# Patient Record
Sex: Female | Born: 1935 | Race: Black or African American | Hispanic: No | Smoking: Never smoker
Health system: Southern US, Community
[De-identification: ages and names within clinical notes are randomized; demographics above are authoritative.]

## PROBLEM LIST (undated history)

## (undated) DIAGNOSIS — C541 Malignant neoplasm of endometrium: Secondary | ICD-10-CM

## (undated) DIAGNOSIS — I10 Essential (primary) hypertension: Secondary | ICD-10-CM

## (undated) DIAGNOSIS — J302 Other seasonal allergic rhinitis: Secondary | ICD-10-CM

## (undated) HISTORY — PX: TONSILLECTOMY AND ADENOIDECTOMY: SUR1326

---

## 2011-10-09 HISTORY — PX: ABDOMINAL HYSTERECTOMY: SHX81

## 2014-11-11 ENCOUNTER — Inpatient Hospital Stay (HOSPITAL_COMMUNITY)
Admission: EM | Admit: 2014-11-11 | Discharge: 2014-11-16 | DRG: 065 | Disposition: A | Discharge status: Rehabilitation Facility | Payer: Medicare Other | Attending: Neurology | Admitting: Neurology

## 2014-11-11 ENCOUNTER — Emergency Department (HOSPITAL_COMMUNITY): Payer: Medicare Other

## 2014-11-11 ENCOUNTER — Encounter (HOSPITAL_COMMUNITY): Payer: Self-pay | Admitting: Emergency Medicine

## 2014-11-11 DIAGNOSIS — I10 Essential (primary) hypertension: Secondary | ICD-10-CM | POA: Diagnosis present

## 2014-11-11 DIAGNOSIS — D649 Anemia, unspecified: Secondary | ICD-10-CM | POA: Diagnosis present

## 2014-11-11 DIAGNOSIS — Z9119 Patient's noncompliance with other medical treatment and regimen: Secondary | ICD-10-CM | POA: Diagnosis present

## 2014-11-11 DIAGNOSIS — I253 Aneurysm of heart: Secondary | ICD-10-CM | POA: Diagnosis present

## 2014-11-11 DIAGNOSIS — E785 Hyperlipidemia, unspecified: Secondary | ICD-10-CM | POA: Diagnosis present

## 2014-11-11 DIAGNOSIS — G8191 Hemiplegia, unspecified affecting right dominant side: Secondary | ICD-10-CM | POA: Diagnosis present

## 2014-11-11 DIAGNOSIS — I610 Nontraumatic intracerebral hemorrhage in hemisphere, subcortical: Principal | ICD-10-CM | POA: Diagnosis present

## 2014-11-11 DIAGNOSIS — Z79899 Other long term (current) drug therapy: Secondary | ICD-10-CM | POA: Diagnosis not present

## 2014-11-11 DIAGNOSIS — Z90710 Acquired absence of both cervix and uterus: Secondary | ICD-10-CM | POA: Diagnosis not present

## 2014-11-11 DIAGNOSIS — I619 Nontraumatic intracerebral hemorrhage, unspecified: Secondary | ICD-10-CM | POA: Diagnosis present

## 2014-11-11 DIAGNOSIS — E876 Hypokalemia: Secondary | ICD-10-CM | POA: Diagnosis present

## 2014-11-11 DIAGNOSIS — I615 Nontraumatic intracerebral hemorrhage, intraventricular: Secondary | ICD-10-CM | POA: Diagnosis not present

## 2014-11-11 DIAGNOSIS — R4701 Aphasia: Secondary | ICD-10-CM | POA: Diagnosis present

## 2014-11-11 DIAGNOSIS — I69151 Hemiplegia and hemiparesis following nontraumatic intracerebral hemorrhage affecting right dominant side: Secondary | ICD-10-CM | POA: Diagnosis not present

## 2014-11-11 DIAGNOSIS — I618 Other nontraumatic intracerebral hemorrhage: Secondary | ICD-10-CM | POA: Insufficient documentation

## 2014-11-11 DIAGNOSIS — R2990 Unspecified symptoms and signs involving the nervous system: Secondary | ICD-10-CM

## 2014-11-11 DIAGNOSIS — I161 Hypertensive emergency: Secondary | ICD-10-CM

## 2014-11-11 HISTORY — DX: Other seasonal allergic rhinitis: J30.2

## 2014-11-11 HISTORY — DX: Malignant neoplasm of endometrium: C54.1

## 2014-11-11 HISTORY — DX: Essential (primary) hypertension: I10

## 2014-11-11 LAB — I-STAT TROPONIN, ED: TROPONIN I, POC: 0.01 ng/mL (ref 0.00–0.08)

## 2014-11-11 LAB — DIFFERENTIAL
Basophils Absolute: 0 10*3/uL (ref 0.0–0.1)
Basophils Relative: 1 % (ref 0–1)
EOS ABS: 0.1 10*3/uL (ref 0.0–0.7)
Eosinophils Relative: 1 % (ref 0–5)
LYMPHS ABS: 1.8 10*3/uL (ref 0.7–4.0)
LYMPHS PCT: 26 % (ref 12–46)
MONOS PCT: 7 % (ref 3–12)
Monocytes Absolute: 0.5 10*3/uL (ref 0.1–1.0)
NEUTROS PCT: 65 % (ref 43–77)
Neutro Abs: 4.6 10*3/uL (ref 1.7–7.7)

## 2014-11-11 LAB — RAPID URINE DRUG SCREEN, HOSP PERFORMED
Amphetamines: NOT DETECTED
BARBITURATES: NOT DETECTED
Benzodiazepines: NOT DETECTED
COCAINE: NOT DETECTED
Opiates: NOT DETECTED
Tetrahydrocannabinol: NOT DETECTED

## 2014-11-11 LAB — URINALYSIS, ROUTINE W REFLEX MICROSCOPIC
Bilirubin Urine: NEGATIVE
Glucose, UA: NEGATIVE mg/dL
HGB URINE DIPSTICK: NEGATIVE
KETONES UR: NEGATIVE mg/dL
Leukocytes, UA: NEGATIVE
NITRITE: NEGATIVE
Protein, ur: NEGATIVE mg/dL
Specific Gravity, Urine: 1.009 (ref 1.005–1.030)
UROBILINOGEN UA: 0.2 mg/dL (ref 0.0–1.0)
pH: 6.5 (ref 5.0–8.0)

## 2014-11-11 LAB — COMPREHENSIVE METABOLIC PANEL
ALBUMIN: 4 g/dL (ref 3.5–5.2)
ALK PHOS: 88 U/L (ref 39–117)
ALT: 9 U/L (ref 0–35)
ANION GAP: 10 (ref 5–15)
AST: 29 U/L (ref 0–37)
BUN: 17 mg/dL (ref 6–23)
CHLORIDE: 112 mmol/L (ref 96–112)
CO2: 21 mmol/L (ref 19–32)
Calcium: 9 mg/dL (ref 8.4–10.5)
Creatinine, Ser: 1.18 mg/dL — ABNORMAL HIGH (ref 0.50–1.10)
GFR calc Af Amer: 49 mL/min — ABNORMAL LOW (ref >90)
GFR calc non Af Amer: 43 mL/min — ABNORMAL LOW (ref >90)
GLUCOSE: 89 mg/dL (ref 70–99)
Potassium: 3.7 mmol/L (ref 3.5–5.1)
Sodium: 143 mmol/L (ref 135–145)
Total Bilirubin: 0.4 mg/dL (ref 0.3–1.2)
Total Protein: 7.5 g/dL (ref 6.0–8.3)

## 2014-11-11 LAB — CBC
HCT: 39.4 % (ref 36.0–46.0)
Hemoglobin: 12.6 g/dL (ref 12.0–15.0)
MCH: 25.8 pg — ABNORMAL LOW (ref 26.0–34.0)
MCHC: 32 g/dL (ref 30.0–36.0)
MCV: 80.6 fL (ref 78.0–100.0)
Platelets: 187 10*3/uL (ref 150–400)
RBC: 4.89 MIL/uL (ref 3.87–5.11)
RDW: 15.5 % (ref 11.5–15.5)
WBC: 7 10*3/uL (ref 4.0–10.5)

## 2014-11-11 LAB — I-STAT CHEM 8, ED
BUN: 22 mg/dL (ref 6–23)
CREATININE: 1.1 mg/dL (ref 0.50–1.10)
Calcium, Ion: 1.11 mmol/L — ABNORMAL LOW (ref 1.13–1.30)
Chloride: 108 mmol/L (ref 96–112)
Glucose, Bld: 96 mg/dL (ref 70–99)
HCT: 42 % (ref 36.0–46.0)
HEMOGLOBIN: 14.3 g/dL (ref 12.0–15.0)
POTASSIUM: 3.7 mmol/L (ref 3.5–5.1)
SODIUM: 143 mmol/L (ref 135–145)
TCO2: 21 mmol/L (ref 0–100)

## 2014-11-11 LAB — APTT: aPTT: 29 seconds (ref 24–37)

## 2014-11-11 LAB — MRSA PCR SCREENING: MRSA by PCR: NEGATIVE

## 2014-11-11 LAB — PROTIME-INR
INR: 1.09 (ref 0.00–1.49)
Prothrombin Time: 14.2 seconds (ref 11.6–15.2)

## 2014-11-11 LAB — ETHANOL: Alcohol, Ethyl (B): <5 mg/dL (ref 0–9)

## 2014-11-11 MED ORDER — NICARDIPINE HCL IN NACL 20-0.86 MG/200ML-% IV SOLN
3.0000 mg/h | INTRAVENOUS | Status: DC
Start: 2014-11-11 — End: 2014-11-13
  Administered 2014-11-11 (×2): 8.5 mg/h via INTRAVENOUS
  Administered 2014-11-11: 5 mg/h via INTRAVENOUS
  Administered 2014-11-11: 8.5 mg/h via INTRAVENOUS
  Administered 2014-11-12 (×2): 10 mg/h via INTRAVENOUS
  Administered 2014-11-12: 9 mg/h via INTRAVENOUS
  Administered 2014-11-12 (×2): 10 mg/h via INTRAVENOUS
  Filled 2014-11-11 (×8): qty 200

## 2014-11-11 MED ORDER — SODIUM CHLORIDE 0.9 % IV SOLN
INTRAVENOUS | Status: DC
  Administered 2014-11-11 – 2014-11-12 (×2): via INTRAVENOUS
  Administered 2014-11-13: 75 mL/h via INTRAVENOUS
  Administered 2014-11-14 – 2014-11-15 (×2): via INTRAVENOUS

## 2014-11-11 MED ORDER — SENNOSIDES-DOCUSATE SODIUM 8.6-50 MG PO TABS
1.0000 | ORAL_TABLET | Freq: Two times a day (BID) | ORAL | Status: DC
  Administered 2014-11-11 – 2014-11-15 (×7): 1 via ORAL
  Filled 2014-11-11 (×11): qty 1

## 2014-11-11 MED ORDER — NICARDIPINE HCL IN NACL 20-0.86 MG/200ML-% IV SOLN
3.0000 mg/h | Freq: Once | INTRAVENOUS | Status: AC
  Filled 2014-11-11: qty 200

## 2014-11-11 MED ORDER — PANTOPRAZOLE SODIUM 40 MG IV SOLR
40.0000 mg | Freq: Every day | INTRAVENOUS | Status: DC
  Administered 2014-11-11 – 2014-11-12 (×2): 40 mg via INTRAVENOUS
  Filled 2014-11-11 (×3): qty 40

## 2014-11-11 MED ORDER — STROKE: EARLY STAGES OF RECOVERY BOOK
Freq: Once | Status: DC
  Filled 2014-11-11: qty 1

## 2014-11-11 MED ORDER — ACETAMINOPHEN 325 MG PO TABS: 650.0000 mg | ORAL_TABLET | ORAL | Status: DC | PRN

## 2014-11-11 MED ORDER — ACETAMINOPHEN 650 MG RE SUPP: 650.0000 mg | RECTAL | Status: DC | PRN

## 2014-11-11 MED ORDER — LABETALOL HCL 5 MG/ML IV SOLN
INTRAVENOUS | Status: AC
  Administered 2014-11-13: 20 mg via INTRAVENOUS
  Filled 2014-11-11: qty 4

## 2014-11-11 MED ORDER — LABETALOL HCL 5 MG/ML IV SOLN: 10.0000 mg | Freq: Once | INTRAVENOUS | Status: DC

## 2014-11-11 MED ORDER — LABETALOL HCL 5 MG/ML IV SOLN
10.0000 mg | INTRAVENOUS | Status: DC | PRN
  Administered 2014-11-11: 10 mg via INTRAVENOUS
  Administered 2014-11-13: 20 mg via INTRAVENOUS
  Filled 2014-11-11: qty 4

## 2014-11-11 NOTE — ED Notes (Signed)
MD at bedside. 

## 2014-11-11 NOTE — ED Provider Notes (Signed)
CSN: [id number]     Arrival date & time 11/11/14  1545 History   First MD Initiated Contact with Patient 11/11/14 1547     Chief Complaint  Patient presents with  . Code Stroke     (Consider location/radiation/quality/duration/timing/severity/associated sxs/prior Treatment) The history is provided by the patient.  Brenda Blanchard is a 81 y.o. female hx of HTN uncompliant with meds here presenting with possible stroke. Around 1:50 PM she was in the bathroom and had a sudden onset of right-sided weakness and facial droop. She had trouble walking of the time. Denies any slurred speech. Arrival by EMS and code stroke activated. She was noted to be hypertensive on arrival.  Level V caveat- condition of patient    Past Medical History  Diagnosis Date  . Hypertension    History reviewed. No pertinent past surgical history. No family history on file. History  Substance Use Topics  . Smoking status: Not on file  . Smokeless tobacco: Not on file  . Alcohol Use: No   OB History    No data available     Review of Systems  Neurological: Positive for weakness.  All other systems reviewed and are negative.     Allergies  Review of patient's allergies indicates no known allergies.  Home Medications   Prior to Admission medications   Medication Sig Start Date End Date Taking? Authorizing Provider  aspirin 325 MG tablet Take 325 mg by mouth every 6 (six) hours as needed for mild pain or moderate pain.   Yes Historical Provider, MD  atenolol (TENORMIN) 100 MG tablet Take 100 mg by mouth daily.   Yes Historical Provider, MD  cloNIDine (CATAPRES) 0.3 MG tablet Take 0.3 mg by mouth 2 (two) times daily.   Yes Historical Provider, MD   BP 190/101 mmHg  Pulse 83  Temp(Src) 98.4 F (36.9 C) (Oral)  Resp 23  Ht 5\' 5"  (1.651 m)  Wt 190 lb (86.183 kg)  BMI 31.62 kg/m2  SpO2 99% Physical Exam  Constitutional:  Ill appearing   HENT:  Head: Normocephalic.  Mouth/Throat: Oropharynx is clear  and moist.  Eyes: Conjunctivae and EOM are normal. Pupils are equal, round, and reactive to light.  Neck: Normal range of motion. Neck supple.  Cardiovascular: Normal rate, regular rhythm and normal heart sounds.   Pulmonary/Chest: Effort normal and breath sounds normal. No respiratory distress. She has no wheezes. She has no rales.  Abdominal: Soft. Bowel sounds are normal. She exhibits no distension. There is no tenderness. There is no rebound and no guarding.  Musculoskeletal: Normal range of motion. She exhibits no edema or tenderness.  Neurological:  R facial droop. Strength 4/5 R side.   Skin: Skin is warm and dry.  Psychiatric: She has a normal mood and affect. Her behavior is normal. Judgment and thought content normal.  Nursing note and vitals reviewed.   ED Course  Procedures (including critical care time)  CRITICAL CARE Performed by: Darl Householder, DAVID   Total critical care time: 30 min   Critical care time was exclusive of separately billable procedures and treating other patients.  Critical care was necessary to treat or prevent imminent or life-threatening deterioration.  Critical care was time spent personally by me on the following activities: development of treatment plan with patient and/or surrogate as well as nursing, discussions with consultants, evaluation of patient's response to treatment, examination of patient, obtaining history from patient or surrogate, ordering and performing treatments and interventions, ordering and review of laboratory  studies, ordering and review of radiographic studies, pulse oximetry and re-evaluation of patient's condition.   Labs Review Labs Reviewed  CBC - Abnormal; Notable for the following:    MCH 25.8 (*)    All other components within normal limits  I-STAT CHEM 8, ED - Abnormal; Notable for the following:    Calcium, Ion 1.11 (*)    All other components within normal limits  DIFFERENTIAL  ETHANOL  PROTIME-INR  APTT   COMPREHENSIVE METABOLIC PANEL  URINE RAPID DRUG SCREEN (HOSP PERFORMED)  URINALYSIS, ROUTINE W REFLEX MICROSCOPIC  I-STAT TROPOININ, ED  I-STAT TROPOININ, ED    Imaging Review Ct Head Wo Contrast  11/11/2014   CLINICAL DATA:  81 year old female code stroke with sudden onset right side weakness and facial droop at 1530 hrs. Initial encounter.  EXAM: CT HEAD WITHOUT CONTRAST  TECHNIQUE: Contiguous axial images were obtained from the base of the skull through the vertex without intravenous contrast.  COMPARISON:  None.  FINDINGS: 20 x 22 mm area of hyperdense hemorrhage in the superior left thalamus, tracking into the posterior left corona radiata. Estimated intra-axial hemorrhage volume is 7 mL. There is intraventricular extension, with a small volume of intraventricular hemorrhage. No ventriculomegaly. Trace rightward midline shift. No other extra-axial hemorrhage identified.  Superimposed patchy bilateral cerebral white matter disease. No acute cortically based infarct identified. No suspicious intracranial vascular hyperdensity.  Visualized paranasal sinuses and mastoids are clear. No acute osseous abnormality identified. No acute orbit or scalp soft tissue findings.  IMPRESSION: 1. Acute intra-axial hemorrhage measuring 2 cm. Favor hypertensive/small vessel disease related. Small volume intraventricular extension. However, no ventriculomegaly or significant intracranial mass effect. 2. Underlying chronic white matter changes. Critical Value/emergent results were called by telephone at the time of interpretation on 11/11/2014 at 1603 hrs to Dr. Dorian Pod , who verbally acknowledged these results.   Electronically Signed   By: Lars Pinks M.D.   On: 11/11/2014 16:04     EKG Interpretation   Date/Time:  Thursday November 11 2014 16:00:41 EST Ventricular Rate:  86 PR Interval:  198 QRS Duration: 135 QT Interval:  434 QTC Calculation: 519 R Axis:   -31 Text Interpretation:  Sinus rhythm Atrial  premature complex Probable left  atrial enlargement Left bundle branch block No previous ECGs available  Confirmed by YAO  MD, DAVID ([id number]) on 11/11/2014 4:04:35 PM      MDM   Final diagnoses:  None   Brenda Blanchard is a 81 y.o. female here with R sided weakness, facial droop. Code stroke activated. Neuro at beside. CT showed pontine bleed. Hypertensive in the ED. Will give labetalol and start nicardipine drip for hypertensive emergency causing intra cerebral hemorrhage. Will admit to neuro ICU.   4:44 PM BP improved on cardene drip. Will got to neuro ICU.    Wandra Arthurs, MD 11/11/14 [phone]

## 2014-11-11 NOTE — Code Documentation (Signed)
81yo female arriving to [facility] via [facility] at [phone].  Patient with sudden onset right sided weakness while getting ready in her bathroom.  She crawled to her kitchen to call her sister who called 32.  EMS assessed right facial droop and right sided weakness.  Patient was reportedly unsteady and "dragging" her right leg.  EMS reports that the patient has a h/o hypertension and did not take her medications today.  SBP >200 en route.  Stroke team at the bedside on arrival.  EDP cleared patient and patient to CT.  CT showing ICH.  Patient back to room.  Orders to treat BP.  NIHSS 6, see documentation for details and code stroke times.  Treat BP to keep SBP <140 per Dr. Armida Sans. Bedside handoff with ED RN Lexine Baton.

## 2014-11-11 NOTE — ED Notes (Signed)
Per ems last known well time 1350. Pt was in bathroom getting ready and had a sudden onset on right sided weakness and facial droop.

## 2014-11-11 NOTE — Consult Note (Addendum)
Referring Physician: ED    Chief Complaint: right hemiparesis, right face weakness.  HPI:                                                                                                                                         Brenda Blanchard is an 81 y.o. female with a past medical history significant for untreated HTN for quite some time, brought in via EMS due to acute onset of the above stated symptoms. As per EMS, she was in the bathroom getting ready and had a sudden onset on right sided weakness and facial droop. SBP>200 upon EMS arrival. She reportedly took 2 tabs aspirin before calling EMS. Initial evaluation in the ED demonstrated a right hemiparesis with mild right face drop but preserved mental status. Received 10 mg IV labetalol. CT brain was reviewed by myself and showed a 20 x 22 mm area of hyperdense hemorrhage in the superior left thalamus, tracking into the posterior left corona radiata. Estimated intra-axial hemorrhage volume is 7 mL. There is intraventricular extension, with a small volume of intraventricular hemorrhage. No ventriculomegaly. Trace rightward midline shift.  Denies HA, vertigo, double vision, slurred speech, or visual disturbances. PTT 29, INR 1.09, platelets 187.  Date last known well: 11/11/14 Time last known well: 1:50 PM tPA Given: no ,ICH   Past Medical History  Diagnosis Date  . Hypertension     History reviewed. No pertinent past surgical history.  No family history on file. Social History:  reports that she does not drink alcohol. Her tobacco and drug histories are not on file. Family history: no epilepsy, brain tumors, brain aneurysms. Allergies: No Known Allergies  Medications:                                                                                                                           I have reviewed the patient's current medications.  ROS:  History obtained from chart review and patient  General ROS: negative for - chills, fatigue, fever, night sweats, weight gain or weight loss Psychological ROS: negative for - behavioral disorder, hallucinations, memory difficulties, mood swings or suicidal ideation Ophthalmic ROS: negative for - blurry vision, double vision, eye pain or loss of vision ENT ROS: negative for - epistaxis, nasal discharge, oral lesions, sore throat, tinnitus or vertigo Allergy and Immunology ROS: negative for - hives or itchy/watery eyes Hematological and Lymphatic ROS: negative for - bleeding problems, bruising or swollen lymph nodes Endocrine ROS: negative for - galactorrhea, hair pattern changes, polydipsia/polyuria or temperature intolerance Respiratory ROS: negative for - cough, hemoptysis, shortness of breath or wheezing Cardiovascular ROS: negative for - chest pain, dyspnea on exertion, edema or irregular heartbeat Gastrointestinal ROS: negative for - abdominal pain, diarrhea, hematemesis, nausea/vomiting or stool incontinence Genito-Urinary ROS: negative for - dysuria, hematuria, incontinence or urinary frequency/urgency Musculoskeletal ROS: negative for - joint swelling  Neurological ROS: as noted in HPI Dermatological ROS: negative for rash and skin lesion changes    Physical exam: pleasant female in no apparent distress. Blood pressure 205/95, pulse 66, temperature 98.4 F (36.9 C), temperature source Oral, resp. rate 18, height 5\' 5"  (1.651 m), weight 86.183 kg (190 lb), SpO2 98 %. Head: normocephalic. Neck: supple, no bruits, no JVD. Cardiac: no murmurs. Lungs: clear. Abdomen: soft, no tender, no mass. Extremities: no edema. Skin: no rash Neurologic Examination:                                                                                                      General: Mental Status: Alert, oriented, thought content appropriate.  Speech fluent without  evidence of aphasia.  Able to follow 3 step commands without difficulty. Cranial Nerves: II: Discs flat bilaterally; Visual fields grossly normal, pupils equal, round, reactive to light and accommodation III,IV, VI: ptosis not present, extra-ocular motions intact bilaterally V,VII: smile asymmetric due to right face weakness, facial light touch sensation normal bilaterally VIII: hearing normal bilaterally IX,X: gag reflex present XI: bilateral shoulder shrug XII: midline tongue extension without atrophy or fasciculations Motor: Significant for right hemiparesis Tone and bulk:normal tone throughout; no atrophy noted Sensory: Pinprick and light touch intact throughout, bilaterally Deep Tendon Reflexes:  Right: Upper Extremity   Left: Upper extremity   biceps (C-5 to C-6) 2/4   biceps (C-5 to C-6) 2/4 tricep (C7) 2/4    triceps (C7) 2/4 Brachioradialis (C6) 2/4  Brachioradialis (C6) 2/4  Lower Extremity Lower Extremity  quadriceps (L-2 to L-4) 2/4   quadriceps (L-2 to L-4) 2/4 Achilles (S1) 2/4   Achilles (S1) 2/4  Plantars: Right: downgoing   Left: downgoing Cerebellar: Can not perform in he right due to weakness, normal FTN and HKS in the left Gait:  Unable  to test     Results for orders placed or performed during the hospital encounter of 11/11/14 (from the past 48 hour(s))  I-Stat Troponin, ED (not at [facility])     Status: None   Collection Time: 11/11/14  3:55 PM  Result Value Ref Range  Troponin i, poc 0.01 0.00 - 0.08 ng/mL   Comment 3            Comment: Due to the release kinetics of cTnI, a negative result within the first hours of the onset of symptoms does not rule out myocardial infarction with certainty. If myocardial infarction is still suspected, repeat the test at appropriate intervals.   I-Stat Chem 8, ED     Status: Abnormal   Collection Time: 11/11/14  3:57 PM  Result Value Ref Range   Sodium 143 135 - 145 mmol/L   Potassium 3.7 3.5 - 5.1 mmol/L    Chloride 108 96 - 112 mmol/L   BUN 22 6 - 23 mg/dL   Creatinine, Ser 1.10 0.50 - 1.10 mg/dL   Glucose, Bld 96 70 - 99 mg/dL   Calcium, Ion 1.11 (L) 1.13 - 1.30 mmol/L   TCO2 21 0 - 100 mmol/L   Hemoglobin 14.3 12.0 - 15.0 g/dL   HCT 42.0 36.0 - 46.0 %   Ct Head Wo Contrast  11/11/2014   CLINICAL DATA:  81 year old female code stroke with sudden onset right side weakness and facial droop at 1530 hrs. Initial encounter.  EXAM: CT HEAD WITHOUT CONTRAST  TECHNIQUE: Contiguous axial images were obtained from the base of the skull through the vertex without intravenous contrast.  COMPARISON:  None.  FINDINGS: 20 x 22 mm area of hyperdense hemorrhage in the superior left thalamus, tracking into the posterior left corona radiata. Estimated intra-axial hemorrhage volume is 7 mL. There is intraventricular extension, with a small volume of intraventricular hemorrhage. No ventriculomegaly. Trace rightward midline shift. No other extra-axial hemorrhage identified.  Superimposed patchy bilateral cerebral white matter disease. No acute cortically based infarct identified. No suspicious intracranial vascular hyperdensity.  Visualized paranasal sinuses and mastoids are clear. No acute osseous abnormality identified. No acute orbit or scalp soft tissue findings.  IMPRESSION: 1. Acute intra-axial hemorrhage measuring 2 cm. Favor hypertensive/small vessel disease related. Small volume intraventricular extension. However, no ventriculomegaly or significant intracranial mass effect. 2. Underlying chronic white matter changes. Critical Value/emergent results were called by telephone at the time of interpretation on 11/11/2014 at 1603 hrs to Dr. Dorian Pod , who verbally acknowledged these results.   Electronically Signed   By: Lars Pinks M.D.   On: 11/11/2014 16:04     Assessment: 81 y.o. female with untreated HTN for quite some time, brought in with acute right hemiparesis and right face weakness. SBP>200. CT brain disclosed  a 20 x 22 mm area of hyperdense hemorrhage in the superior left thalamus, tracking into the posterior left corona radiata. Estimated intra-axial hemorrhage volume is 7 mL. There is intraventricular extension, with a small volume of intraventricular hemorrhage. No ventriculomegaly. Trace rightward midline shift.  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 Mental status is preserved at this moment. Admit to the NICU. Start nicardipine, target SBP<140. Do not administer antiplatelets or anticoagulants. Follow up CT brain in am.  Dorian Pod, MD [facility] [phone]  11/11/2014, 4:28 PM

## 2014-11-11 NOTE — ED Provider Notes (Signed)
MSE was initiated and I personally evaluated the patient and placed orders (if any) at  3:48 PM on November 11, 2014.  The patient appears stable so that the remainder of the MSE may be completed by another provider.  81 year old female last seen normal at 1350 collapsed in bathroom and noted was unable to walk. She has moderate to severe right-sided weakness. EMS reports unable to stand. Normal blood sugar en route but hypertensive with systolic blood pressure of 215. On exam, lungs are clear and no airway issues. Moderate right hemiparesis present but speech is normal. Sent for CT scanning.  Delora Fuel, MD 99/24/26 [id number]

## 2014-11-12 ENCOUNTER — Inpatient Hospital Stay (HOSPITAL_COMMUNITY): Payer: Medicare Other

## 2014-11-12 ENCOUNTER — Encounter (HOSPITAL_COMMUNITY): Payer: Self-pay

## 2014-11-12 DIAGNOSIS — I10 Essential (primary) hypertension: Secondary | ICD-10-CM

## 2014-11-12 DIAGNOSIS — E785 Hyperlipidemia, unspecified: Secondary | ICD-10-CM

## 2014-11-12 DIAGNOSIS — I615 Nontraumatic intracerebral hemorrhage, intraventricular: Secondary | ICD-10-CM

## 2014-11-12 LAB — CBC
HEMATOCRIT: 35.4 % — AB (ref 36.0–46.0)
Hemoglobin: 11.7 g/dL — ABNORMAL LOW (ref 12.0–15.0)
MCH: 26.2 pg (ref 26.0–34.0)
MCHC: 33.1 g/dL (ref 30.0–36.0)
MCV: 79.4 fL (ref 78.0–100.0)
Platelets: 211 10*3/uL (ref 150–400)
RBC: 4.46 MIL/uL (ref 3.87–5.11)
RDW: 15.5 % (ref 11.5–15.5)
WBC: 8 10*3/uL (ref 4.0–10.5)

## 2014-11-12 LAB — BASIC METABOLIC PANEL
Anion gap: 9 (ref 5–15)
BUN: 12 mg/dL (ref 6–23)
CALCIUM: 8.5 mg/dL (ref 8.4–10.5)
CO2: 20 mmol/L (ref 19–32)
CREATININE: 0.84 mg/dL (ref 0.50–1.10)
Chloride: 112 mmol/L (ref 96–112)
GFR calc Af Amer: 75 mL/min — ABNORMAL LOW (ref >90)
GFR, EST NON AFRICAN AMERICAN: 64 mL/min — AB (ref >90)
Glucose, Bld: 120 mg/dL — ABNORMAL HIGH (ref 70–99)
Potassium: 3.5 mmol/L (ref 3.5–5.1)
SODIUM: 141 mmol/L (ref 135–145)

## 2014-11-12 LAB — LIPID PANEL
Cholesterol: 203 mg/dL — ABNORMAL HIGH (ref 0–200)
HDL: 67 mg/dL (ref >39)
LDL CALC: 125 mg/dL — AB (ref 0–99)
Total CHOL/HDL Ratio: 3 RATIO
Triglycerides: 54 mg/dL (ref <150)
VLDL: 11 mg/dL (ref 0–40)

## 2014-11-12 LAB — TSH: TSH: 0.799 u[IU]/mL (ref 0.350–4.500)

## 2014-11-12 LAB — T4, FREE: FREE T4: 1.06 ng/dL (ref 0.80–1.80)

## 2014-11-12 MED ORDER — LISINOPRIL 10 MG PO TABS
10.0000 mg | ORAL_TABLET | Freq: Every day | ORAL | Status: DC
  Administered 2014-11-12: 10 mg via ORAL
  Filled 2014-11-12: qty 1

## 2014-11-12 MED ORDER — ATENOLOL 100 MG PO TABS
100.0000 mg | ORAL_TABLET | Freq: Every day | ORAL | Status: DC
  Administered 2014-11-12 – 2014-11-16 (×5): 100 mg via ORAL
  Filled 2014-11-12 (×5): qty 1

## 2014-11-12 MED ORDER — CLONIDINE HCL 0.1 MG PO TABS
0.3000 mg | ORAL_TABLET | Freq: Two times a day (BID) | ORAL | Status: DC
  Administered 2014-11-12 – 2014-11-16 (×9): 0.3 mg via ORAL
  Filled 2014-11-12 (×2): qty 3
  Filled 2014-11-12 (×2): qty 1
  Filled 2014-11-12 (×2): qty 3
  Filled 2014-11-12: qty 1
  Filled 2014-11-12: qty 3
  Filled 2014-11-12: qty 1
  Filled 2014-11-12: qty 3

## 2014-11-12 NOTE — Progress Notes (Signed)
PT Cancellation Note  Patient Details Name: Jatoria Kneeland MRN: [id number] DOB: 1934-07-01   Cancelled Treatment:    Reason Eval/Treat Not Completed: Patient not medically ready.  Pt currently on bedrest and noted neuro changes overnight.  Will hold PT at this time and await updated activity orders when appropriate for mobility.     Kamayah Pillay, Thornton Papas 11/12/2014, 7:52 AM

## 2014-11-12 NOTE — Progress Notes (Signed)
OT Cancellation Note  Patient Details Name: Brenda Blanchard MRN: [id number] DOB: 11-May-1934   Cancelled Treatment:    Reason Eval/Treat Not Completed: Patient not medically ready Currently has bed rest orders. Please update activity orders when appropriate to initiate therapy. Thanks  [place], OTR/L  [phone] 11/12/2014 11/12/2014, 7:18 AM

## 2014-11-12 NOTE — Progress Notes (Signed)
UR completed.  Emili Mcloughlin, RN BSN [facility] CCM Trauma/Neuro ICU Case Manager [phone]  

## 2014-11-12 NOTE — Progress Notes (Signed)
STROKE TEAM PROGRESS NOTE   HISTORY OF PRESENT ILLNESS Brenda Blanchard is an 81 y.o. female with a past medical history significant for untreated HTN for quite some time, brought in via EMS due to acute onset of the above stated symptoms. As per EMS, she was in the bathroom getting ready and had a sudden onset on right sided weakness and facial droop. SBP>200 upon EMS arrival. She reportedly took 2 tabs aspirin before calling EMS. Initial evaluation in the ED demonstrated a right hemiparesis with mild right face drop but preserved mental status. Received 10 mg IV labetalol. CT brain was reviewed by myself and showed a 20 x 22 mm area of hyperdense hemorrhage in the superior left thalamus, tracking into the posterior left corona radiata. Estimated intra-axial hemorrhage volume is 7 mL. There is intraventricular extension, with a small volume of intraventricular hemorrhage. No ventriculomegaly. Trace rightward midline shift.  Denies HA, vertigo, double vision, slurred speech, or visual disturbances. PTT 29, INR 1.09, platelets 187.  Date last known well: 11/11/14 Time last known well: 1:50 PM tPA Given: no ,ICH   SUBJECTIVE (INTERVAL HISTORY) No family is at the bedside.  Overall she feels her condition is stable. She still has transcortical aphasia, with right upper extremity drift. Repeat CAT scan this morning shows stable hematoma. Still on Cardene drip, passed swallow, will put on by mouth medications to taper off Cardene drip.   OBJECTIVE Temp:  [98.3 F (36.8 C)-98.9 F (37.2 C)] 98.7 F (37.1 C) (02/05 1140) Pulse Rate:  [58-110] 61 (02/05 1415) Cardiac Rhythm:  [-] Sinus tachycardia (02/05 0800) Resp:  [13-27] 16 (02/05 1415) BP: (102-246)/(48-101) 102/57 mmHg (02/05 1415) SpO2:  [93 %-100 %] 93 % (02/05 1415) Weight:  [190 lb (86.183 kg)] 190 lb (86.183 kg) (02/04 1601)  No results for input(s): GLUCAP in the last 168 hours.  Recent Labs Lab 11/11/14 1551 11/11/14 1557  11/12/14 0659  NA 143 143 141  K 3.7 3.7 3.5  CL 112 108 112  CO2 21  --  20  GLUCOSE 89 96 120*  BUN 17 22 12   CREATININE 1.18* 1.10 0.84  CALCIUM 9.0  --  8.5    Recent Labs Lab 11/11/14 1551  AST 29  ALT 9  ALKPHOS 88  BILITOT 0.4  PROT 7.5  ALBUMIN 4.0    Recent Labs Lab 11/11/14 1551 11/11/14 1557 11/12/14 0659  WBC 7.0  --  8.0  NEUTROABS 4.6  --   --   HGB 12.6 14.3 11.7*  HCT 39.4 42.0 35.4*  MCV 80.6  --  79.4  PLT 187  --  211   No results for input(s): CKTOTAL, CKMB, CKMBINDEX, TROPONINI in the last 168 hours.  Recent Labs  11/11/14 1551  LABPROT 14.2  INR 1.09    Recent Labs  11/11/14 1641  COLORURINE YELLOW  LABSPEC 1.009  PHURINE 6.5  GLUCOSEU NEGATIVE  HGBUR NEGATIVE  BILIRUBINUR NEGATIVE  KETONESUR NEGATIVE  PROTEINUR NEGATIVE  UROBILINOGEN 0.2  NITRITE NEGATIVE  LEUKOCYTESUR NEGATIVE       Component Value Date/Time   CHOL 203* 11/12/2014 0655   TRIG 54 11/12/2014 0655   HDL 67 11/12/2014 0655   CHOLHDL 3.0 11/12/2014 0655   VLDL 11 11/12/2014 0655   LDLCALC 125* 11/12/2014 0655   No results found for: HGBA1C    Component Value Date/Time   LABOPIA NONE DETECTED 11/11/2014 1641   COCAINSCRNUR NONE DETECTED 11/11/2014 1641   LABBENZ NONE DETECTED 11/11/2014 1641  AMPHETMU NONE DETECTED 11/11/2014 1641   THCU NONE DETECTED 11/11/2014 1641   LABBARB NONE DETECTED 11/11/2014 1641     Recent Labs Lab 11/11/14 1551  ETH <5    I have personally reviewed the radiological images below and agree with the radiology interpretations.  Ct Head Wo Contrast  11/12/2014   IMPRESSION: Evolving LEFT thalamus intraparenchymal 2 cm hematoma with intraventricular extension. No hydrocephalus.  Mild white matter changes can be seen with chronic small vessel ischemic disease. Involutional changes.      Ct Head Wo Contrast  11/11/2014   IMPRESSION: 1. Acute intra-axial hemorrhage measuring 2 cm. Favor hypertensive/small vessel disease  related. Small volume intraventricular extension. However, no ventriculomegaly or significant intracranial mass effect. 2. Underlying chronic white matter changes.    Carotid Doppler  not ordered  2D Echocardiogram  pending  PHYSICAL EXAM  Temp:  [98.3 F (36.8 C)-98.9 F (37.2 C)] 98.7 F (37.1 C) (02/05 1140) Pulse Rate:  [58-110] 61 (02/05 1415) Resp:  [13-27] 16 (02/05 1415) BP: (102-246)/(48-101) 102/57 mmHg (02/05 1415) SpO2:  [93 %-100 %] 93 % (02/05 1415) Weight:  [190 lb (86.183 kg)] 190 lb (86.183 kg) (02/04 1601)  General - Well nourished, well developed, in no apparent distress.  Ophthalmologic - fundi not visualized due to eye movement.  Cardiovascular - Regular rate and rhythm with no murmur.  Neck - supple, no carotid bruits  Mental Status -  Awake alert and orientation to self and place, not orientated to time, age, people partially due to transcortical motor aphasia. Language exam showed transcortical motor aphasia, able to repeat, but word salad, impaired on naming parts of objects. Follows midline and the peripheral simple commands, not able to follow complex commands.   Cranial Nerves II - XII - II - Visual field intact OU. III, IV, VI - Extraocular movements intact. V - Facial sensation intact bilaterally. VII - mild right facial droop. VIII - Hearing & vestibular intact bilaterally. X - Palate elevates symmetrically. XI - Chin turning & shoulder shrug intact bilaterally. XII - Tongue protrusion intact.  Motor Strength - The patient's strength was 4-/5 right upper extremity with pronator drift was absent, right lower extremity proximal 5/5, distal 4/5, left upper and lower extremity 5/5.  Bulk was normal and fasciculations were absent.   Motor Tone - Muscle tone was assessed at the neck and appendages and was normal.  Reflexes - The patient's reflexes were 1+ in all extremities and she had equivocal pathological reflexes.  Sensory - Light touch,  temperature/pinprick were assessed and were decreased on the right face.    Coordination - The patient had normal movements in the left hand, ataxic on the right FTN but not out of proportion to the weakness. Tremor was absent.  Gait and Station - not tested due to in Cardene drip.   ASSESSMENT/PLAN Ms. Aneisa Polson is a 81 y.o. female with history of hypertension noncompliant with medication admitted fLeft basal ganglia hemorrhage extension to ventricles. Repeat CAT scan showed stable hematoma Symptoms Stable  left basal ganglia hemorrhage with ventricle extension secondary to  uncontrolled hypertension and smalll vessel disease  repeat CT shows stable hematoma with ventricle extension  2D Echo  pending  LDL  125, not at goal  HgbA1c Pending  SCD for VTE prophylaxis  Diet Heart   aspirin 325 mg orally every day prior to admission, now on no antithrombotic  Ongoing aggressive stroke risk factor management  Therapy recommendations:  Pending  Disposition:  Pending  Hypertension, malignant  Home meds:   Atenolol and clonidine  Currently resumed home medication and add on lisinopril 10 for better blood pressure control  Stable  Taper off Cardene as possible  Patient counseled to be compliant with her blood pressure medications  Hyperlipidemia  Home meds:  None   Currently on none  LDL 125, goal < 70  Hold off statin for now due to hemorrhage  Consider statin at discharge  Other Stroke Risk Factors  Advanced age  Other Active Problems  Transcortical motor aphasia  Other Pertinent History    Hospital day # 1  This patient is critically ill due to cerebral hemorrhage with ventricular extension and at significant risk of neurological worsening, death form enlargement of the hematoma, cerebral edema and brain herniation. This patient's care requires constant monitoring of vital signs, hemodynamics, respiratory and cardiac monitoring, review of multiple  databases, neurological assessment, discussion with family, other specialists and medical decision making of high complexity. I spent 40 minutes of neurocritical care time in the care of this patient.  Rosalin Hawking, MD PhD Stroke Neurology 11/12/2014 3:19 PM    To contact Stroke Continuity provider, please refer to [email or web address] After hours, contact General Neurology

## 2014-11-12 NOTE — Progress Notes (Signed)
  Echocardiogram 2D Echocardiogram has been performed.  Brenda Blanchard 11/12/2014, 2:32 PM

## 2014-11-12 NOTE — Evaluation (Signed)
Speech Language Pathology Evaluation Patient Details Name: Brenda Blanchard MRN: [id number] DOB: 05-30-1934 Today's Date: 11/12/2014 Time: 6384-5364 SLP Time Calculation (min) (ACUTE ONLY): 34 min  Problem List:  Patient Active Problem List   Diagnosis Date Noted  . Stroke due to intracerebral hemorrhage 11/11/2014   Past Medical History:  Past Medical History  Diagnosis Date  . Hypertension    Past Surgical History: History reviewed. No pertinent past surgical history. HPI:  81 y.o. female hx of HTN uncompliant with meds admitted with sudden onset of right-sided weakness and facial droop. Head CT evolving LEFT thalamus intraparenchymal 2 cm hematoma with intraventricular extension.   Assessment / Plan / Recommendation Clinical Impression  Pt exhibits aphasia (mix of transcortical motor and Broca's; 4/7 correct on sentence repetition task) with awareness of errors requiring max therapeutic strategies (phrase completion, phonemic cues, visual feedback, written directions). Comprehension is mostly intact with impairments in complex and abstract information. Written directions assisted accuracy of 2 step commands. Presently wriitng is not a functional tool to convey ideas. SLP encouraged her to stop when verbal errors are recognized, start over, use synonyms. Appeared to have right neglect. Min-mild cognitive deficits in functional problem solving. Pt is an excellent rehab candidate from a speech standpoint.     SLP Assessment  Patient needs continued Speech Lanaguage Pathology Services    Follow Up Recommendations  Inpatient Rehab    Frequency and Duration min 2x/week  2 weeks   Pertinent Vitals/Pain Pain Assessment: No/denies pain   SLP Goals  Potential to Achieve Goals (ACUTE ONLY): Good  SLP Evaluation Prior Functioning  Cognitive/Linguistic Baseline: Information not available (suspect WFL prior)   Cognition  Overall Cognitive Status: Impaired/Different from  baseline Arousal/Alertness: Awake/alert Orientation Level: Oriented to person;Oriented to place;Oriented to time;Oriented to situation (via y/n questions if needed) Attention: Sustained Sustained Attention: Appears intact Memory:  (to assess further) Awareness: Appears intact Problem Solving: Impaired Problem Solving Impairment: Functional basic Safety/Judgment:  (questionable)    Comprehension  Auditory Comprehension Overall Auditory Comprehension: Impaired Yes/No Questions: Within Functional Limits Commands: Impaired Two Step Basic Commands: 50-74% accurate Conversation: Simple Visual Recognition/Discrimination Discrimination: Not tested Reading Comprehension Reading Status: Within funtional limits (need to assess comprehension)    Expression Expression Primary Mode of Expression: Verbal Verbal Expression Overall Verbal Expression: Impaired Initiation: No impairment Level of Generative/Spontaneous Verbalization: Sentence Repetition: Impaired Level of Impairment:  (4/7 correct) Naming: Impairment Responsive: Not tested Confrontation: Impaired Convergent: 75-100% accurate (83%) Divergent: Not tested Verbal Errors: Phonemic paraphasias;Aware of errors;Neologisms;Semantic paraphasias Pragmatics: No impairment Written Expression Dominant Hand: Right Written Expression: Exceptions to [facility] Self Formulation Ability: Word   Oral / Motor Oral Motor/Sensory Function Overall Oral Motor/Sensory Function: Impaired Labial ROM: Reduced right Labial Symmetry: Abnormal symmetry right Labial Strength: Reduced Lingual ROM: Reduced right;Reduced left Lingual Symmetry: Within Functional Limits Lingual Strength: Reduced Facial ROM: Reduced right Velum: Within Functional Limits Mandible: Within Functional Limits Motor Speech Overall Motor Speech: Impaired Respiration: Within functional limits Phonation: Normal Resonance: Within functional limits Articulation: Within functional  limitis Intelligibility: Intelligible Motor Planning: Impaired Level of Impairment: Sentence Motor Speech Errors: Aware Effective Techniques: Pause   GO     Brenda Blanchard 11/12/2014, 10:27 AM  Orbie Pyo Colvin Caroli.Ed [employer] [phone]

## 2014-11-12 NOTE — Progress Notes (Signed)
0515 paged Neurology regarding patient having difficulty voiding. Bladder scan reading 769 ml. Orders from Dr. Nicole Kindred to insert foley catheter.

## 2014-11-12 NOTE — Progress Notes (Signed)
0720 paged neurology regarding neuro change. Patient now asphasic.Spoke with Dr. Aram Beecham. Follow up CT was done at 0430 prior to neuro change. No new orders at this time. Information has been relayed to oncoming nurse.

## 2014-11-13 DIAGNOSIS — I618 Other nontraumatic intracerebral hemorrhage: Secondary | ICD-10-CM | POA: Insufficient documentation

## 2014-11-13 LAB — CBC
HCT: 35.5 % — ABNORMAL LOW (ref 36.0–46.0)
Hemoglobin: 11.4 g/dL — ABNORMAL LOW (ref 12.0–15.0)
MCH: 25.7 pg — AB (ref 26.0–34.0)
MCHC: 32.1 g/dL (ref 30.0–36.0)
MCV: 80.1 fL (ref 78.0–100.0)
Platelets: 197 10*3/uL (ref 150–400)
RBC: 4.43 MIL/uL (ref 3.87–5.11)
RDW: 15.7 % — AB (ref 11.5–15.5)
WBC: 9.9 10*3/uL (ref 4.0–10.5)

## 2014-11-13 LAB — BASIC METABOLIC PANEL
Anion gap: 9 (ref 5–15)
BUN: 12 mg/dL (ref 6–23)
CALCIUM: 8.2 mg/dL — AB (ref 8.4–10.5)
CO2: 20 mmol/L (ref 19–32)
Chloride: 110 mmol/L (ref 96–112)
Creatinine, Ser: 0.85 mg/dL (ref 0.50–1.10)
GFR calc Af Amer: 74 mL/min — ABNORMAL LOW (ref >90)
GFR calc non Af Amer: 63 mL/min — ABNORMAL LOW (ref >90)
Glucose, Bld: 101 mg/dL — ABNORMAL HIGH (ref 70–99)
Potassium: 3.4 mmol/L — ABNORMAL LOW (ref 3.5–5.1)
Sodium: 139 mmol/L (ref 135–145)

## 2014-11-13 LAB — HEMOGLOBIN A1C
Hgb A1c MFr Bld: 6.6 % — ABNORMAL HIGH (ref 4.8–5.6)
MEAN PLASMA GLUCOSE: 143 mg/dL

## 2014-11-13 MED ORDER — PANTOPRAZOLE SODIUM 40 MG PO TBEC
40.0000 mg | DELAYED_RELEASE_TABLET | Freq: Every day | ORAL | Status: DC
  Administered 2014-11-13 – 2014-11-15 (×3): 40 mg via ORAL
  Filled 2014-11-13 (×3): qty 1

## 2014-11-13 NOTE — Progress Notes (Signed)
STROKE TEAM PROGRESS NOTE   HISTORY OF PRESENT ILLNESS Brenda Blanchard is an 81 y.o. female with a past medical history significant for untreated HTN for quite some time, brought in via EMS due to acute onset of the above stated symptoms. As per EMS, she was in the bathroom getting ready and had a sudden onset on right sided weakness and facial droop. SBP>200 upon EMS arrival. She reportedly took 2 tabs aspirin before calling EMS. Initial evaluation in the ED demonstrated a right hemiparesis with mild right face drop but preserved mental status. Received 10 mg IV labetalol. CT brain was reviewed by myself and showed a 20 x 22 mm area of hyperdense hemorrhage in the superior left thalamus, tracking into the posterior left corona radiata. Estimated intra-axial hemorrhage volume is 7 mL. There is intraventricular extension, with a small volume of intraventricular hemorrhage. No ventriculomegaly. Trace rightward midline shift.  Denies HA, vertigo, double vision, slurred speech, or visual disturbances. PTT 29, INR 1.09, platelets 187.  Date last known well: 11/11/14 Time last known well: 1:50 PM tPA Given: no ,ICH   SUBJECTIVE (INTERVAL HISTORY) No family is at the bedside.  Overall she feels her condition is stable. She still has mild nonfluentl aphasia, with right upper extremity drift. Repeat CAT scan this morning shows stable hematoma. Still on Cardene drip, passed swallow, will put on by mouth medications to taper off Cardene drip.   OBJECTIVE Temp:  [97.8 F (36.6 C)-98.4 F (36.9 C)] 97.8 F (36.6 C) (02/06 1633) Pulse Rate:  [53-66] 56 (02/06 1300) Cardiac Rhythm:  [-] Normal sinus rhythm (02/06 1000) Resp:  [12-24] 20 (02/06 1300) BP: (112-165)/(52-117) 162/70 mmHg (02/06 1300) SpO2:  [90 %-100 %] 96 % (02/06 1300)  No results for input(s): GLUCAP in the last 168 hours.  Recent Labs Lab 11/11/14 1551 11/11/14 1557 11/12/14 0659 11/13/14 0225  NA 143 143 141 139  K 3.7 3.7 3.5  3.4*  CL 112 108 112 110  CO2 21  --  20 20  GLUCOSE 89 96 120* 101*  BUN 17 22 12 12   CREATININE 1.18* 1.10 0.84 0.85  CALCIUM 9.0  --  8.5 8.2*    Recent Labs Lab 11/11/14 1551  AST 29  ALT 9  ALKPHOS 88  BILITOT 0.4  PROT 7.5  ALBUMIN 4.0    Recent Labs Lab 11/11/14 1551 11/11/14 1557 11/12/14 0659 11/13/14 0225  WBC 7.0  --  8.0 9.9  NEUTROABS 4.6  --   --   --   HGB 12.6 14.3 11.7* 11.4*  HCT 39.4 42.0 35.4* 35.5*  MCV 80.6  --  79.4 80.1  PLT 187  --  211 197   No results for input(s): CKTOTAL, CKMB, CKMBINDEX, TROPONINI in the last 168 hours.  Recent Labs  11/11/14 1551  LABPROT 14.2  INR 1.09    Recent Labs  11/11/14 1641  COLORURINE YELLOW  LABSPEC 1.009  PHURINE 6.5  GLUCOSEU NEGATIVE  HGBUR NEGATIVE  BILIRUBINUR NEGATIVE  KETONESUR NEGATIVE  PROTEINUR NEGATIVE  UROBILINOGEN 0.2  NITRITE NEGATIVE  LEUKOCYTESUR NEGATIVE       Component Value Date/Time   CHOL 203* 11/12/2014 0655   TRIG 54 11/12/2014 0655   HDL 67 11/12/2014 0655   CHOLHDL 3.0 11/12/2014 0655   VLDL 11 11/12/2014 0655   LDLCALC 125* 11/12/2014 0655   Lab Results  Component Value Date   HGBA1C 6.6* 11/12/2014      Component Value Date/Time   LABOPIA NONE DETECTED  11/11/2014 [place] 11/11/2014 1641   LABBENZ NONE DETECTED 11/11/2014 1641   AMPHETMU NONE DETECTED 11/11/2014 1641   THCU NONE DETECTED 11/11/2014 1641   LABBARB NONE DETECTED 11/11/2014 1641     Recent Labs Lab 11/11/14 1551  [place]ley <5    I have personally reviewed the radiological images below and agree with the radiology interpretations.  Ct Head Wo Contrast  11/12/2014   IMPRESSION: Evolving LEFT thalamus intraparenchymal 2 cm hematoma with intraventricular extension. No hydrocephalus.  Mild white matter changes can be seen with chronic small vessel ischemic disease. Involutional changes.      Ct Head Wo Contrast  11/11/2014   IMPRESSION: 1. Acute intra-axial  hemorrhage measuring 2 cm. Favor hypertensive/small vessel disease related. Small volume intraventricular extension. However, no ventriculomegaly or significant intracranial mass effect. 2. Underlying chronic white matter changes.    Carotid Doppler  not ordered  2D Echocardiogram  pending  PHYSICAL EXAM  Temp:  [97.8 F (36.6 C)-98.4 F (36.9 C)] 97.8 F (36.6 C) (02/06 1633) Pulse Rate:  [53-66] 56 (02/06 1300) Resp:  [12-24] 20 (02/06 1300) BP: (112-165)/(52-117) 162/70 mmHg (02/06 1300) SpO2:  [90 %-100 %] 96 % (02/06 1300)  General - Well nourished, well developed, in no apparent distress.  Ophthalmologic - fundi not visualized due to eye movement.  Cardiovascular - Regular rate and rhythm with no murmur.  Neck - supple, no carotid bruits  Mental Status -  Awake alert and orientation to self and place, not orientated to time, age, people partially due to transcortical motor aphasia. Language exam showed transcortical motor aphasia, able to repeat, but word salad, impaired on naming parts of objects. Follows midline and the peripheral simple commands, not able to follow complex commands.   Cranial Nerves II - XII - II - Visual field intact OU. III, IV, VI - Extraocular movements intact. V - Facial sensation intact bilaterally. VII - mild right facial droop. VIII - Hearing & vestibular intact bilaterally. X - Palate elevates symmetrically. XI - Chin turning & shoulder shrug intact bilaterally. XII - Tongue protrusion intact.  Motor Strength - The patient's strength was 4-/5 right upper extremity with pronator drift was absent, right lower extremity proximal 5/5, distal 4/5, left upper and lower extremity 5/5.  Bulk was normal and fasciculations were absent.   Motor Tone - Muscle tone was assessed at the neck and appendages and was normal.  Reflexes - The patient's reflexes were 1+ in all extremities and she had equivocal pathological reflexes.  Sensory - Light touch,  temperature/pinprick were assessed and were decreased on the right face.    Coordination - The patient had normal movements in the left hand, ataxic on the right FTN but not out of proportion to the weakness. Tremor was absent.  Gait and Station - not tested due to in Cardene drip.   ASSESSMENT/PLAN Ms. Brenda Blanchard is a 81 y.o. female with history of hypertension noncompliant with medication admitted fLeft thalamic hemorrhage extension to ventricles. Repeat CAT scan showed stable hematoma Symptoms Stable  left basal ganglia hemorrhage with ventricle extension secondary to  uncontrolled hypertension and smalll vessel disease  repeat CT shows stable hematoma with ventricle extension  2D Echo  pending  LDL  125, not at goal  HgbA1c Pending  SCD for VTE prophylaxis  Diet Heart   aspirin 325 mg orally every day prior to admission, now on no antithrombotic  Ongoing aggressive stroke risk factor management  Therapy recommendations:  Pending  Disposition:  Pending  Hypertension, malignant  Home meds:   Atenolol and clonidine  Currently resumed home medication and add on lisinopril 10 for better blood pressure control  Stable  Taper off Cardene as possible  Patient counseled to be compliant with her blood pressure medications  Hyperlipidemia  Home meds:  None   Currently on none  LDL 125, goal < 70  Hold off statin for now due to hemorrhage  Consider statin at discharge  Other Stroke Risk Factors  Advanced age  Other Active Problems  Transcortical motor aphasia  Other Pertinent History    Hospital day # 2  She appears neurologically stable and follow-up CT scan shows no significant increase in hemorrhage. Plan to transfer her to the neurology floor bed today. Continue strict control of hypertension. Patient counseled to be compliant with her medications. Mobilize out of bed. Physical occupational therapy consults. Antony Contras, MD Stroke  Neurology 11/13/2014 4:48 PM    To contact Stroke Continuity provider, please refer to [email or web address] After hours, contact General Neurology

## 2014-11-13 NOTE — Progress Notes (Signed)
Patient arrived to 4N. A&O x4. Vital signs documented. MD paged regarding BP. Call bell within patient's reach. Will continue to monitor.

## 2014-11-13 NOTE — Evaluation (Signed)
Physical Therapy Evaluation Patient Details Name: Brenda Blanchard MRN: [id number] DOB: [birth year] Today's Date: 11/13/2014   History of Present Illness  Pt is 81 y/o female admitted 11/11/14 due to right sided weakness with MRI showing left basal ganglia ICH due to uncontrolled HTN.   Clinical Impression  Pt admitted with above diagnosis. Pt currently with functional limitations due to the deficits listed below (see PT Problem List). Pt currently will benefit from further therapy prior to d/c home alone.   Pt will benefit from skilled PT to increase their independence and safety with mobility to allow discharge to the venue listed below.       Follow Up Recommendations CIR;Supervision/Assistance - 24 hour    Equipment Recommendations   (need to further assess)    Recommendations for Other Services Rehab consult     Precautions / Restrictions Precautions Precautions: Fall Restrictions Weight Bearing Restrictions: No      Mobility  Bed Mobility Overal bed mobility: +2 for physical assistance;Needs Assistance Bed Mobility: Supine to Sit     Supine to sit: Mod assist;+2 for physical assistance;+2 for safety/equipment     General bed mobility comments: Assistance to advance hips to EOB and cues for proper LE and UE placement  Transfers Overall transfer level: Needs assistance   Transfers: Stand Pivot Transfers   Stand pivot transfers: Mod assist;+2 safety/equipment       General transfer comment: Assistance to advance hips and cues for LE placement as well as assistance to maintain balance.   Ambulation/Gait Ambulation/Gait assistance:  (unable to ambulate at this time)              Science writer    Modified Rankin (Stroke Patients Only) Modified Rankin (Stroke Patients Only) Pre-Morbid Rankin Score: No symptoms Modified Rankin: Severe disability     Balance Overall balance assessment: Needs assistance Sitting-balance support:  Feet supported Sitting balance-Leahy Scale: Fair Sitting balance - Comments: Occasional posterior sway with intermittent minimal assistance to find midline Postural control: Posterior lean Standing balance support: Bilateral upper extremity supported Standing balance-Leahy Scale: Poor Standing balance comment: Occasional right LE knee buckling in standing with weight bearing with right side lean                             Pertinent Vitals/Pain Pain Assessment: No/denies pain    Home Living Family/patient expects to be discharged to:: Private residence Living Arrangements: Alone   Type of Home: House Home Access: Stairs to enter Entrance Stairs-Rails: Right Entrance Stairs-Number of Steps: 2 Home Layout: One level Home Equipment: None Additional Comments: Pt was independent and driving prior to admission    Prior Function Level of Independence: Independent               Hand Dominance   Dominant Hand: Right    Extremity/Trunk Assessment   Upper Extremity Assessment: Defer to OT evaluation           Lower Extremity Assessment: RLE deficits/detail RLE Deficits / Details: 3+/5 gross        Communication   Communication: No difficulties  Cognition Arousal/Alertness: Awake/alert Behavior During Therapy: WFL for tasks assessed/performed Overall Cognitive Status: Impaired/Different from baseline Area of Impairment: Awareness           Awareness: Emergent   General Comments: Pt unaware of sensory deficits or visual changes when initially asked however assessment determines impaired  touch sensation on right side.    General Comments      Exercises        Assessment/Plan    PT Assessment Patient needs continued PT services  PT Diagnosis Difficulty walking;Generalized weakness   PT Problem List Decreased strength;Decreased activity tolerance;Decreased balance;Decreased mobility;Decreased coordination;Decreased cognition;Decreased safety  awareness;Decreased knowledge of use of DME;Impaired sensation  PT Treatment Interventions DME instruction;Gait training;Functional mobility training;Therapeutic activities;Therapeutic exercise;Balance training;Neuromuscular re-education;Patient/family education   PT Goals (Current goals can be found in the Care Plan section) Acute Rehab PT Goals Patient Stated Goal: To go home and be independent  PT Goal Formulation: With patient Time For Goal Achievement: 11/27/14 Potential to Achieve Goals: Good    Frequency Min 4X/week   Barriers to discharge Decreased caregiver support      Co-evaluation               End of Session Equipment Utilized During Treatment: Gait belt Activity Tolerance: Patient tolerated treatment well Patient left: in chair;with call bell/phone within reach;with nursing/sitter in room Nurse Communication: Mobility status         Time: 9935-7017 PT Time Calculation (min) (ACUTE ONLY): 32 min   Charges:   PT Evaluation $Initial PT Evaluation Tier I: 1 Procedure PT Treatments $Therapeutic Activity: 8-22 mins   PT G Codes:        Yamen Castrogiovanni 11-30-14, 10:28 AM  Antoine Poche, PT DPT [phone]

## 2014-11-13 NOTE — Progress Notes (Signed)
Aphasia is much worse when first awakened.  Begins to improve after 10-15 minutes of conversation.  Neuro assessment otherwise unchanged.

## 2014-11-14 LAB — BASIC METABOLIC PANEL
Anion gap: 7 (ref 5–15)
BUN: 10 mg/dL (ref 6–23)
CHLORIDE: 110 mmol/L (ref 96–112)
CO2: 21 mmol/L (ref 19–32)
CREATININE: 0.86 mg/dL (ref 0.50–1.10)
Calcium: 8.1 mg/dL — ABNORMAL LOW (ref 8.4–10.5)
GFR calc Af Amer: 73 mL/min — ABNORMAL LOW (ref >90)
GFR calc non Af Amer: 63 mL/min — ABNORMAL LOW (ref >90)
Glucose, Bld: 105 mg/dL — ABNORMAL HIGH (ref 70–99)
Potassium: 3.2 mmol/L — ABNORMAL LOW (ref 3.5–5.1)
Sodium: 138 mmol/L (ref 135–145)

## 2014-11-14 LAB — CBC
HEMATOCRIT: 32 % — AB (ref 36.0–46.0)
Hemoglobin: 10.3 g/dL — ABNORMAL LOW (ref 12.0–15.0)
MCH: 25.6 pg — ABNORMAL LOW (ref 26.0–34.0)
MCHC: 32.2 g/dL (ref 30.0–36.0)
MCV: 79.4 fL (ref 78.0–100.0)
PLATELETS: 175 10*3/uL (ref 150–400)
RBC: 4.03 MIL/uL (ref 3.87–5.11)
RDW: 15.5 % (ref 11.5–15.5)
WBC: 8.7 10*3/uL (ref 4.0–10.5)

## 2014-11-14 MED ORDER — POTASSIUM CHLORIDE CRYS ER 20 MEQ PO TBCR
20.0000 meq | EXTENDED_RELEASE_TABLET | Freq: Two times a day (BID) | ORAL | Status: AC
  Administered 2014-11-14 – 2014-11-15 (×4): 20 meq via ORAL
  Filled 2014-11-14 (×4): qty 1

## 2014-11-14 NOTE — Progress Notes (Signed)
STROKE TEAM PROGRESS NOTE   HISTORY OF PRESENT ILLNESS Brenda Blanchard is an 81 y.o. female with a past medical history significant for untreated HTN for quite some time, brought in via EMS due to acute onset of right hemiparesis, right face weakness. As per EMS, she was in the bathroom getting ready and had a sudden onset on right sided weakness and facial droop. SBP>200 upon EMS arrival. She reportedly took 2 tabs aspirin before calling EMS. Initial evaluation in the ED demonstrated a right hemiparesis with mild right face drop but preserved mental status. Received 10 mg IV labetalol. CT brain was reviewed by myself and showed a 20 x 22 mm area of hyperdense hemorrhage in the superior left thalamus, tracking into the posterior left corona radiata. Estimated intra-axial hemorrhage volume is 7 mL. There is intraventricular extension, with a small volume of intraventricular hemorrhage. No ventriculomegaly. Trace rightward midline shift.  Denies HA, vertigo, double vision, slurred speech, or visual disturbances. PTT 29, INR 1.09, platelets 187.  Date last known well: 11/11/14 Time last known well: 1:50 PM tPA Given: no ,ICH   SUBJECTIVE (INTERVAL HISTORY) Family member at the bedside. The patient is without complaints.   OBJECTIVE Temp:  [97.8 F (36.6 C)-99.5 F (37.5 C)] 98.4 F (36.9 C) (02/07 0926) Pulse Rate:  [51-70] 59 (02/07 0926) Cardiac Rhythm:  [-] Sinus bradycardia;Normal sinus rhythm (02/06 2000) Resp:  [16-21] 18 (02/07 0926) BP: (92-165)/(36-118) 137/58 mmHg (02/07 0926) SpO2:  [91 %-99 %] 98 % (02/07 0926)  No results for input(s): GLUCAP in the last 168 hours.  Recent Labs Lab 11/11/14 1551 11/11/14 1557 11/12/14 0659 11/13/14 0225 11/14/14 0633  NA 143 143 141 139 138  K 3.7 3.7 3.5 3.4* 3.2*  CL 112 108 112 110 110  CO2 21  --  20 20 21   GLUCOSE 89 96 120* 101* 105*  BUN 17 22 12 12 10   CREATININE 1.18* 1.10 0.84 0.85 0.86  CALCIUM 9.0  --  8.5 8.2* 8.1*     Recent Labs Lab 11/11/14 1551  AST 29  ALT 9  ALKPHOS 88  BILITOT 0.4  PROT 7.5  ALBUMIN 4.0    Recent Labs Lab 11/11/14 1551 11/11/14 1557 11/12/14 0659 11/13/14 0225 11/14/14 0633  WBC 7.0  --  8.0 9.9 8.7  NEUTROABS 4.6  --   --   --   --   HGB 12.6 14.3 11.7* 11.4* 10.3*  HCT 39.4 42.0 35.4* 35.5* 32.0*  MCV 80.6  --  79.4 80.1 79.4  PLT 187  --  211 197 175   No results for input(s): CKTOTAL, CKMB, CKMBINDEX, TROPONINI in the last 168 hours.  Recent Labs  11/11/14 1551  LABPROT 14.2  INR 1.09    Recent Labs  11/11/14 1641  COLORURINE YELLOW  LABSPEC 1.009  PHURINE 6.5  GLUCOSEU NEGATIVE  HGBUR NEGATIVE  BILIRUBINUR NEGATIVE  KETONESUR NEGATIVE  PROTEINUR NEGATIVE  UROBILINOGEN 0.2  NITRITE NEGATIVE  LEUKOCYTESUR NEGATIVE       Component Value Date/Time   CHOL 203* 11/12/2014 0655   TRIG 54 11/12/2014 0655   HDL 67 11/12/2014 0655   CHOLHDL 3.0 11/12/2014 0655   VLDL 11 11/12/2014 0655   LDLCALC 125* 11/12/2014 0655   Lab Results  Component Value Date   HGBA1C 6.6* 11/12/2014      Component Value Date/Time   LABOPIA NONE DETECTED 11/11/2014 1641   COCAINSCRNUR NONE DETECTED 11/11/2014 1641   LABBENZ NONE DETECTED 11/11/2014 1641  AMPHETMU NONE DETECTED 11/11/2014 1641   THCU NONE DETECTED 11/11/2014 1641   LABBARB NONE DETECTED 11/11/2014 1641     Recent Labs Lab 11/11/14 1551  ETH <5    I have personally reviewed the radiological images below and agree with the radiology interpretations.  Ct Head Wo Contrast 11/12/2014    Evolving LEFT thalamus intraparenchymal 2 cm hematoma with intraventricular extension. No hydrocephalus.  Mild white matter changes can be seen with chronic small vessel ischemic disease. Involutional changes.      Ct Head Wo Contrast 11/11/2014    1. Acute intra-axial hemorrhage measuring 2 cm. Favor hypertensive/small vessel disease related. Small volume intraventricular extension. However, no  ventriculomegaly or significant intracranial mass effect.  2. Underlying chronic white matter changes.    Carotid Doppler  not ordered  2D Echocardiogram   11/12/2014 Study Conclusions - Left ventricle: The cavity size was normal. Wall thickness was increased in a pattern of moderate LVH. Systolic function was normal. The estimated ejection fraction was in the range of 55% to 60%. Wall motion was normal; there were no regional wall motion abnormalities. Doppler parameters are consistent with abnormal left ventricular relaxation (grade 1 diastolic dysfunction). - Left atrium: The atrium was moderately dilated. - Atrial septum: There was an atrial septal aneurysm. - Pericardium, extracardiac: A trivial pericardial effusion was identified. Impressions: - Normal LV function; moderate LVH; grade 1 diastolic dysfunction; moderate LAE; atrial septal aneurysm.   PHYSICAL EXAM  Temp:  [97.8 F (36.6 C)-99.5 F (37.5 C)] 98.4 F (36.9 C) (02/07 0926) Pulse Rate:  [51-70] 59 (02/07 0926) Resp:  [16-21] 18 (02/07 0926) BP: (92-165)/(36-118) 137/58 mmHg (02/07 0926) SpO2:  [91 %-99 %] 98 % (02/07 0926)  General - Well nourished, well developed, in no apparent distress.  Ophthalmologic - fundi not visualized due to eye movement.  Cardiovascular - Regular rate and rhythm with no murmur.  Neck - supple, no carotid bruits  Mental Status -  Awake alert and orientation to self and place, not orientated to time, age, people partially due to transcortical motor aphasia. Language exam showed transcortical motor aphasia, able to repeat, but word salad, impaired on naming parts of objects. Follows midline and the peripheral simple commands, not able to follow complex commands.   Cranial Nerves II - XII - II - Visual field intact OU. III, IV, VI - Extraocular movements intact. V - Facial sensation intact bilaterally. VII - mild right facial droop. VIII - Hearing & vestibular  intact bilaterally. X - Palate elevates symmetrically. XI - Chin turning & shoulder shrug intact bilaterally. XII - Tongue protrusion intact.  Motor Strength - The patient's strength was 4-/5 right upper extremity with pronator drift was absent, right lower extremity proximal 5/5, distal 4/5, left upper and lower extremity 5/5.  Bulk was normal and fasciculations were absent.   Motor Tone - Muscle tone was assessed at the neck and appendages and was normal.  Reflexes - The patient's reflexes were 1+ in all extremities and she had equivocal pathological reflexes.  Sensory - Light touch, temperature/pinprick were assessed and were decreased on the right face.    Coordination - The patient had normal movements in the left hand, ataxic on the right FTN but not out of proportion to the weakness. Tremor was absent.  Gait and Station - not tested due to in Cardene drip.   ASSESSMENT/PLAN Ms. Brenda Blanchard is a 81 y.o. female with history of hypertension noncompliant with medication admitted Left thalamic hemorrhage extension  to ventricles. Repeat CAT scan showed stable hematoma Symptoms Stable  left basal ganglia hemorrhage with ventricle extension secondary to  uncontrolled hypertension and smalll vessel disease  repeat CT shows stable hematoma with ventricle extension  2D Echo - ejection fraction 50-55%. No cardiac source of emboli identified.  LDL  125, not at goal  HgbA1c - 6.6  SCD for VTE prophylaxis  Diet Heart with thin liquids.  aspirin 325 mg orally every day prior to admission, now on no antithrombotic secondary to [place].  Ongoing aggressive stroke risk factor management  Therapy recommendations:  CIR recommended  Disposition:  Pending  Hypertension, malignant  Home meds:   Atenolol and clonidine  Currently resumed home medication and add on lisinopril 10 for better blood pressure control  Stable  Taper off Cardene as possible  Patient counseled to be compliant with  her blood pressure medications  Hyperlipidemia  Home meds:  None   Currently on none  LDL 125, goal < 70  Hold off statin for now due to hemorrhage  Consider statin at discharge  Other Stroke Risk Factors  Advanced age  Other Active Problems  Transcortical motor aphasia  Anemia - recheck in a.m.  Hypokalemia - supplement    PLAN  CIR recommended. Screening ordered/  Recheck labs Monday a.m.  Blood pressure well controlled on current regimen. May be able to decrease beta blocker  secondary to bradycardia.  Consider statin at time of discharge      Hospital day # [place] PA-C [facility] Pager [phone] 11/14/2014, 10:35 AM I have personally examined this patient, reviewed notes, independently viewed imaging studies, participated in medical decision making and plan of care. I have made any additions or clarifications directly to the above note. Agree with note above. Await inpatient rehabilitation consult. Anticipate transfer to rehabilitation over the next couple of days  Antony Contras, [place] Pager: [phone] 11/14/2014 3:31 PM    To contact Stroke Continuity provider, please refer to [email or web address] After hours, contact General Neurology

## 2014-11-15 LAB — CBC
HCT: 30.7 % — ABNORMAL LOW (ref 36.0–46.0)
HEMOGLOBIN: 9.9 g/dL — AB (ref 12.0–15.0)
MCH: 26.2 pg (ref 26.0–34.0)
MCHC: 32.2 g/dL (ref 30.0–36.0)
MCV: 81.2 fL (ref 78.0–100.0)
PLATELETS: 159 10*3/uL (ref 150–400)
RBC: 3.78 MIL/uL — ABNORMAL LOW (ref 3.87–5.11)
RDW: 15.5 % (ref 11.5–15.5)
WBC: 7.3 10*3/uL (ref 4.0–10.5)

## 2014-11-15 LAB — BASIC METABOLIC PANEL
ANION GAP: 6 (ref 5–15)
BUN: 9 mg/dL (ref 6–23)
CO2: 21 mmol/L (ref 19–32)
Calcium: 8.1 mg/dL — ABNORMAL LOW (ref 8.4–10.5)
Chloride: 112 mmol/L (ref 96–112)
Creatinine, Ser: 0.95 mg/dL (ref 0.50–1.10)
GFR calc Af Amer: 64 mL/min — ABNORMAL LOW (ref >90)
GFR, EST NON AFRICAN AMERICAN: 55 mL/min — AB (ref >90)
Glucose, Bld: 103 mg/dL — ABNORMAL HIGH (ref 70–99)
Potassium: 3.5 mmol/L (ref 3.5–5.1)
Sodium: 139 mmol/L (ref 135–145)

## 2014-11-15 NOTE — Progress Notes (Signed)
Physical Therapy Treatment Patient Details Name: Brenda Blanchard MRN: [id number] DOB: Oct 19, 1933 Today's Date: 11/15/2014    History of Present Illness Pt is 81 y/o female admitted 11/11/14 due to right sided weakness with MRI showing left basal ganglia ICH due to uncontrolled HTN.     PT Comments    Patient progressing well with mobility. Tolerated gait training today with Mod A for balance, weight shifting, trunk control and advancement of RLE. Pt with inattention to RUE however able to attend to it with cues. Continues to exhibit difficulty determining midline and upright posturing with verbal cues however able to facilitate trunk musculature with manual and mirroring cues. Would benefit from pre-gait training and standing balance emphasizing on postural alignment utilizing mirror for feedback. Very motivated. Continues to be appropriate for CIR.   Follow Up Recommendations  CIR;Supervision/Assistance - 24 hour     Equipment Recommendations       Recommendations for Other Services       Precautions / Restrictions Precautions Precautions: Fall Restrictions Weight Bearing Restrictions: No    Mobility  Bed Mobility               General bed mobility comments: Pt sitting up in recliner upon PT arrival.   Transfers Overall transfer level: Needs assistance Equipment used: Rolling walker (2 wheeled) Transfers: Sit to/from Stand Sit to Stand: Mod assist Stand pivot transfers: Mod assist       General transfer comment: Mod A to stand from chair x3 with manual cues for hand/foot placement and for anterior translation. Pt attempting to push up with left forearm. Manual cues to place hands on walker handles and on bed rail during pre gait. Pt pushes to left during standing with trunk rotation.  Ambulation/Gait Ambulation/Gait assistance: Mod assist Ambulation Distance (Feet): 20 Feet Assistive device: Rolling walker (2 wheeled) Gait Pattern/deviations: Step-to  pattern;Step-through pattern;Decreased step length - right;Decreased stride length;Narrow base of support;Trunk flexed (left trunk rotation)   Gait velocity interpretation: Below normal speed for age/gender General Gait Details: Cues to increase step length on RLE to facilitate step through gait. Increased hip/knee flexion throughout gait requiring constant verbal/manual cues for upright posture and hip extension. Pt with left trunk rotation. Fatigues.   Stairs            Wheelchair Mobility    Modified Rankin (Stroke Patients Only) Modified Rankin (Stroke Patients Only) Pre-Morbid Rankin Score: No symptoms Modified Rankin: Moderately severe disability     Balance Overall balance assessment: Needs assistance Sitting-balance support: Feet supported;No upper extremity supported Sitting balance-Leahy Scale: Fair Sitting balance - Comments: Pt with lean towards left however able to self correct to midline with verbal/mirroring cues. Impaired trunk control with LOB posteriorly when sitting without back support. Postural control: Left lateral lean Standing balance support: During functional activity Standing balance-Leahy Scale: Poor                      Cognition Arousal/Alertness: Awake/alert Behavior During Therapy: WFL for tasks assessed/performed Overall Cognitive Status: Impaired/Different from baseline Area of Impairment: Problem solving;Orientation;Attention Orientation Level: Disoriented to;Time Current Attention Level: Selective       Awareness: Intellectual Problem Solving: Difficulty sequencing;Requires verbal cues;Requires tactile cues General Comments: Pt able to identify some of her deficits but not all, most notable during gait.    Exercises General Exercises - Lower Extremity Ankle Circles/Pumps: Both;10 reps;Seated Long Arc Quad: Both;10 reps;Seated Hip Flexion/Marching: Both;10 reps;Seated Other Exercises Other Exercises: Standing balance with  UEs on  bed rail providing max manual cues for midline positioning performing weight shifting while maintaining posture and mini marches. Pt not able to maintain cervical and trunk alignment without manual cues. ~4 minutes.    General Comments General comments (skin integrity, edema, etc.): "When am I going to rehab?      Pertinent Vitals/Pain Pain Assessment: No/denies pain    Home Living Family/patient expects to be discharged to:: Private residence Living Arrangements: Alone   Type of Home: House Home Access: Stairs to enter Entrance Stairs-Rails: Right Home Layout: One level Home Equipment: None Additional Comments: Pt was independent and driving prior to admission    Prior Function Level of Independence: Independent          PT Goals (current goals can now be found in the care plan section) Acute Rehab PT Goals Patient Stated Goal: to get better  Progress towards PT goals: Progressing toward goals    Frequency  Min 4X/week    PT Plan Current plan remains appropriate    Co-evaluation             End of Session Equipment Utilized During Treatment: Gait belt Activity Tolerance: Patient tolerated treatment well Patient left: in chair;with call bell/phone within reach;with chair alarm set     Time: 1132-1200 PT Time Calculation (min) (ACUTE ONLY): 28 min  Charges:  $Gait Training: 8-22 mins $Neuromuscular Re-education: 8-22 mins                    G CodesCandy Sledge A December 03, 2014, 2:04 PM Candy Sledge, [place], [place]

## 2014-11-15 NOTE — Progress Notes (Signed)
Rehab Admissions Coordinator Note:  Patient was screened by Retta Diones for appropriateness for an Inpatient Acute Rehab Consult.  At this time, we are recommending Inpatient Rehab consult.  Jodell Cipro M 11/15/2014, 12:19 PM  I can be reached at [phone].

## 2014-11-15 NOTE — Consult Note (Signed)
Physical Medicine and Rehabilitation Consult   Reason for Consult: Right sided weakness,  Referring Physician: Dr. Leonie Man.    HPI: Brenda Blanchard is a 81 y.o. female with history of untreated HTN who was admitted on 11/11/14 with acute onset of right sided weakness, difficulty talking and right facial droop. SBP>200 at admission and patient treated with IV labetalol. CT head done revealing 20 x 22 mm area of hyperdense hemorrhage in the superior left thalamus, tracking into the posterior left corona radiata with intraventricular extension likely due to hypertensive bleed. Neurology recommended BP management with cardene drip and follow up CCT with stable bleed and no significant change in vasogenic edema. 2D echo with moderate LVH with EF 55-60% and trivial pericardial effusion. Patient with resultant right hemiparesis with mild ataxia, right inattention with visual deficits as well as transcortical aphasia.   PT/OT evaluations done yesterday and CIR recommended by MD and rehab team.   Review of Systems  HENT: Negative for hearing loss.   Eyes: Negative for blurred vision and double vision.  Respiratory: Negative for cough and hemoptysis.   Cardiovascular: Negative for chest pain and palpitations.  Gastrointestinal: Negative for abdominal pain.  Musculoskeletal: Positive for neck pain. Negative for back pain.  Neurological: Positive for sensory change and focal weakness. Negative for dizziness and headaches.      Past Medical History  Diagnosis Date  . Hypertension     History reviewed. No pertinent past surgical history.    History reviewed. No pertinent family history.    Social History:  Lives alone but has a nephew who's in and out. Spread out time between [place] and [place] Retired Engineer, structural. She reports that she has never smoked. She does not have any smokeless tobacco history on file. She reports that she does not drink alcohol. Her drug history is not on file.     Allergies: No Known Allergies    Medications Prior to Admission  Medication Sig Dispense Refill  . aspirin 325 MG tablet Take 325 mg by mouth every 6 (six) hours as needed for mild pain or moderate pain.    Marland Kitchen atenolol (TENORMIN) 100 MG tablet Take 100 mg by mouth daily.    . cloNIDine (CATAPRES) 0.3 MG tablet Take 0.3 mg by mouth 2 (two) times daily.      Home: Home Living Family/patient expects to be discharged to:: Private residence Living Arrangements: Alone Type of Home: House Home Access: Stairs to enter Technical brewer of Steps: 2 Entrance Stairs-Rails: Right Home Layout: One level Home Equipment: None Additional Comments: Pt was independent and driving prior to admission  Functional History: Prior Function Level of Independence: Independent Functional Status:  Mobility: Bed Mobility Overal bed mobility: +2 for physical assistance, Needs Assistance Bed Mobility: Supine to Sit Supine to sit: Mod assist, +2 for physical assistance, +2 for safety/equipment General bed mobility comments: Pt sitting up in recliner Transfers Overall transfer level: Needs assistance Equipment used: 1 person hand held assist Transfers: Sit to/from Stand, Stand Pivot Transfers Sit to Stand: Mod assist Stand pivot transfers: Mod assist General transfer comment: Pt pushes to Lt.  Requires facilitation of Lt. hip for extension  Ambulation/Gait Ambulation/Gait assistance:  (unable to ambulate at this time)    ADL: ADL Overall ADL's : Needs assistance/impaired Eating/Feeding: Set up, Sitting Grooming: Wash/dry hands, Wash/dry face, Oral care, Brushing hair, Minimal assistance, Sitting Upper Body Bathing: Moderate assistance, Sitting Lower Body Bathing: Moderate assistance, Sit to/from stand Lower Body Bathing Details (  indicate cue type and reason): Requirs assist for peri area and for standing  Upper Body Dressing : Moderate assistance, Sitting Lower Body Dressing: Moderate  assistance, Sit to/from stand Lower Body Dressing Details (indicate cue type and reason): assist for pulling pants over hips and for standing balance.  Pt able to don/doff socks with min guard assist sitting in recliner  Toilet Transfer: Moderate assistance, Stand-pivot, BSC Toileting- Clothing Manipulation and Hygiene: Maximal assistance, Sit to/from stand Functional mobility during ADLs: Moderate assistance General ADL Comments: Pt requires assist for balance and Rt. UE weakness  Cognition: Cognition Overall Cognitive Status: Impaired/Different from baseline Arousal/Alertness: Awake/alert Orientation Level: Oriented to person, Oriented to place, Oriented to situation, Disoriented to time Attention: Sustained Sustained Attention: Appears intact Memory:  (to assess further) Awareness: Appears intact Problem Solving: Impaired Problem Solving Impairment: Functional basic Safety/Judgment:  (questionable) Cognition Arousal/Alertness: Awake/alert Behavior During Therapy: WFL for tasks assessed/performed Overall Cognitive Status: Impaired/Different from baseline Area of Impairment: Orientation, Attention, Awareness, Problem solving Orientation Level: Disoriented to, Time Current Attention Level: Selective Awareness: Intellectual Problem Solving: Difficulty sequencing, Requires verbal cues, Requires tactile cues General Comments: Pt is disoriented to the year, but oriented to all else.  She is able to state her deficits, but not yet able to explain how they impact her.     Blood pressure 136/62, pulse 54, temperature 99 F (37.2 C), temperature source Oral, resp. rate 18, height 5\' 5"  (1.651 m), weight 86.183 kg (190 lb), SpO2 100 %. Physical Exam  Nursing note and vitals reviewed. Constitutional: She is oriented to person, place, and time. She appears well-developed and well-nourished.  HENT:  Head: Normocephalic and atraumatic.  Eyes: Conjunctivae are normal. Pupils are equal, round,  and reactive to light.  Neck: Normal range of motion. Neck supple.  Cardiovascular: Normal rate and regular rhythm.   Respiratory: Effort normal and breath sounds normal.  GI: Soft. Bowel sounds are normal. She exhibits no distension. There is no tenderness.  Neurological: She is alert and oriented to person, place, and time.  Ataxic speech. Able to follow commands without difficulty. Right hemiparesis with ataxia and sensory deficits. RUE motor 3+ to4/5. RLE: 4 to 4+/  Shows good awareness and insight into deficits.   Skin: Skin is warm and dry.  Psychiatric: She has a normal mood and affect. Her behavior is normal.  Fair insight and awareness    Results for orders placed or performed during the hospital encounter of 11/11/14 (from the past 24 hour(s))  CBC     Status: Abnormal   Collection Time: 11/15/14  7:10 AM  Result Value Ref Range   WBC 7.3 4.0 - 10.5 K/uL   RBC 3.78 (L) 3.87 - 5.11 MIL/uL   Hemoglobin 9.9 (L) 12.0 - 15.0 g/dL   HCT 30.7 (L) 36.0 - 46.0 %   MCV 81.2 78.0 - 100.0 fL   MCH 26.2 26.0 - 34.0 pg   MCHC 32.2 30.0 - 36.0 g/dL   RDW 15.5 11.5 - 15.5 %   Platelets 159 150 - 400 K/uL  Basic metabolic panel     Status: Abnormal   Collection Time: 11/15/14  7:10 AM  Result Value Ref Range   Sodium 139 135 - 145 mmol/L   Potassium 3.5 3.5 - 5.1 mmol/L   Chloride 112 96 - 112 mmol/L   CO2 21 19 - 32 mmol/L   Glucose, Bld 103 (H) 70 - 99 mg/dL   BUN 9 6 - 23 mg/dL   Creatinine,  Ser 0.95 0.50 - 1.10 mg/dL   Calcium 8.1 (L) 8.4 - 10.5 mg/dL   GFR calc non Af Amer 55 (L) >90 mL/min   GFR calc Af Amer 64 (L) >90 mL/min   Anion gap 6 5 - 15   No results found.  Assessment/Plan: Diagnosis: left thalamic to internal capsule ICH 1. Does the need for close, 24 hr/day medical supervision in concert with the patient's rehab needs make it unreasonable for this patient to be served in a less intensive setting? Yes 2. Co-Morbidities requiring supervision/potential  complications: htn 3. Due to bladder management, bowel management, safety, skin/wound care, disease management, medication administration, pain management and patient education, does the patient require 24 hr/day rehab nursing? Yes 4. Does the patient require coordinated care of a physician, rehab nurse, PT (1-2 hrs/day, 5 days/week) and OT (1-2 hrs/day, 5 days/week) to address physical and functional deficits in the context of the above medical diagnosis(es)? Yes Addressing deficits in the following areas: balance, endurance, locomotion, strength, transferring, bowel/bladder control, bathing, dressing, feeding, grooming, toileting and psychosocial support 5. Can the patient actively participate in an intensive therapy program of at least 3 hrs of therapy per day at least 5 days per week? Yes 6. The potential for patient to make measurable gains while on inpatient rehab is excellent 7. Anticipated functional outcomes upon discharge from inpatient rehab are modified independent  with PT, modified independent and supervision with OT, n/a with SLP. 8. Estimated rehab length of stay to reach the above functional goals is: 13-15 day 9. Does the patient have adequate social supports and living environment to accommodate these discharge functional goals? Yes 10. Anticipated D/C setting: Home 11. Anticipated post D/C treatments: HH therapy and Outpatient therapy 12. Overall Rehab/Functional Prognosis: excellent  RECOMMENDATIONS: This patient's condition is appropriate for continued rehabilitative care in the following setting: CIR Patient has agreed to participate in recommended program. Yes Note that insurance prior authorization may be required for reimbursement for recommended care.  Comment: Rehab Admissions Coordinator to follow up.  Thanks,  Meredith Staggers, MD, Mellody Drown     11/15/2014

## 2014-11-15 NOTE — Progress Notes (Signed)
STROKE TEAM PROGRESS NOTE   HISTORY OF PRESENT ILLNESS Brenda Blanchard is an 81 y.o. female with a past medical history significant for untreated HTN for quite some time, brought in via EMS due to acute onset of right hemiparesis, right face weakness. As per EMS, she was in the bathroom getting ready and had a sudden onset on right sided weakness and facial droop. SBP>200 upon EMS arrival. She reportedly took 2 tabs aspirin before calling EMS. Initial evaluation in the ED demonstrated a right hemiparesis with mild right face drop but preserved mental status. Received 10 mg IV labetalol. CT brain was reviewed by myself and showed a 20 x 22 mm area of hyperdense hemorrhage in the superior left thalamus, tracking into the posterior left corona radiata. Estimated intra-axial hemorrhage volume is 7 mL. There is intraventricular extension, with a small volume of intraventricular hemorrhage. No ventriculomegaly. Trace rightward midline shift.  Denies HA, vertigo, double vision, slurred speech, or visual disturbances. PTT 29, INR 1.09, platelets 187.  Date last known well: 11/11/14 Time last known well: 1:50 PM tPA Given: no ,ICH   SUBJECTIVE (INTERVAL HISTORY) No Family member at the bedside. The patient is without complaints.   OBJECTIVE Temp:  [90.9 F (32.7 C)-99.6 F (37.6 C)] 90.9 F (32.7 C) (02/08 1420) Pulse Rate:  [51-60] 59 (02/08 1420) Cardiac Rhythm:  [-]  Resp:  [18-20] 18 (02/08 1420) BP: (128-154)/(61-80) 128/68 mmHg (02/08 1420) SpO2:  [97 %-100 %] 97 % (02/08 1420)  No results for input(s): GLUCAP in the last 168 hours.  Recent Labs Lab 11/11/14 1551 11/11/14 1557 11/12/14 0659 11/13/14 0225 11/14/14 0633 11/15/14 0710  NA 143 143 141 139 138 139  K 3.7 3.7 3.5 3.4* 3.2* 3.5  CL 112 108 112 110 110 112  CO2 21  --  20 20 21 21   GLUCOSE 89 96 120* 101* 105* 103*  BUN 17 22 12 12 10 9   CREATININE 1.18* 1.10 0.84 0.85 0.86 0.95  CALCIUM 9.0  --  8.5 8.2* 8.1* 8.1*     Recent Labs Lab 11/11/14 1551  AST 29  ALT 9  ALKPHOS 88  BILITOT 0.4  PROT 7.5  ALBUMIN 4.0    Recent Labs Lab 11/11/14 1551 11/11/14 1557 11/12/14 0659 11/13/14 0225 11/14/14 0633 11/15/14 0710  WBC 7.0  --  8.0 9.9 8.7 7.3  NEUTROABS 4.6  --   --   --   --   --   HGB 12.6 14.3 11.7* 11.4* 10.3* 9.9*  HCT 39.4 42.0 35.4* 35.5* 32.0* 30.7*  MCV 80.6  --  79.4 80.1 79.4 81.2  PLT 187  --  211 197 175 159   No results for input(s): CKTOTAL, CKMB, CKMBINDEX, TROPONINI in the last 168 hours. No results for input(s): LABPROT, INR in the last 72 hours. No results for input(s): COLORURINE, LABSPEC, [place], GLUCOSEU, HGBUR, BILIRUBINUR, KETONESUR, PROTEINUR, UROBILINOGEN, NITRITE, LEUKOCYTESUR in the last 72 hours.  Invalid input(s): APPERANCEUR     Component Value Date/Time   CHOL 203* 11/12/2014 0655   TRIG 54 11/12/2014 0655   HDL 67 11/12/2014 0655   CHOLHDL 3.0 11/12/2014 0655   VLDL 11 11/12/2014 0655   LDLCALC 125* 11/12/2014 0655   Lab Results  Component Value Date   HGBA1C 6.6* 11/12/2014      Component Value Date/Time   LABOPIA NONE DETECTED 11/11/2014 1641   COCAINSCRNUR NONE DETECTED 11/11/2014 1641   LABBENZ NONE DETECTED 11/11/2014 1641   AMPHETMU NONE DETECTED 11/11/2014  [place] 11/11/2014 1641   LABBARB NONE DETECTED 11/11/2014 1641     Recent Labs Lab 11/11/14 1551  ETH <5    I have personally reviewed the radiological images below and agree with the radiology interpretations.  Ct Head Wo Contrast 11/12/2014    Evolving LEFT thalamus intraparenchymal 2 cm hematoma with intraventricular extension. No hydrocephalus.  Mild white matter changes can be seen with chronic small vessel ischemic disease. Involutional changes.      Ct Head Wo Contrast 11/11/2014    1. Acute intra-axial hemorrhage measuring 2 cm. Favor hypertensive/small vessel disease related. Small volume intraventricular extension. However, no ventriculomegaly  or significant intracranial mass effect.  2. Underlying chronic white matter changes.    Carotid Doppler  not ordered  2D Echocardiogram   11/12/2014 Study Conclusions - Left ventricle: The cavity size was normal. Wall thickness was increased in a pattern of moderate LVH. Systolic function was normal. The estimated ejection fraction was in the range of 55% to 60%. Wall motion was normal; there were no regional wall motion abnormalities. Doppler parameters are consistent with abnormal left ventricular relaxation (grade 1 diastolic dysfunction). - Left atrium: The atrium was moderately dilated. - Atrial septum: There was an atrial septal aneurysm. - Pericardium, extracardiac: A trivial pericardial effusion was identified. Impressions: - Normal LV function; moderate LVH; grade 1 diastolic dysfunction; moderate LAE; atrial septal aneurysm.   PHYSICAL EXAM  Temp:  [90.9 F (32.7 C)-99.6 F (37.6 C)] 90.9 F (32.7 C) (02/08 1420) Pulse Rate:  [51-60] 59 (02/08 1420) Resp:  [18-20] 18 (02/08 1420) BP: (128-154)/(61-80) 128/68 mmHg (02/08 1420) SpO2:  [97 %-100 %] 97 % (02/08 1420)  General - Well nourished, well developed, in no apparent distress.  Ophthalmologic - fundi not visualized due to eye movement.  Cardiovascular - Regular rate and rhythm with no murmur.  Neck - supple, no carotid bruits  Mental Status -  Awake alert and orientation to self and place, not orientated to time, age, people partially due to transcortical motor aphasia. Language exam showed transcortical motor aphasia, able to repeat, but word salad, impaired on naming parts of objects. Follows midline and the peripheral simple commands, not able to follow complex commands.   Cranial Nerves II - XII - II - Visual field intact OU. III, IV, VI - Extraocular movements intact. V - Facial sensation intact bilaterally. VII - mild right facial droop. VIII - Hearing & vestibular intact  bilaterally. X - Palate elevates symmetrically. XI - Chin turning & shoulder shrug intact bilaterally. XII - Tongue protrusion intact.  Motor Strength - The patient's strength was 4-/5 right upper extremity with pronator drift was absent, right lower extremity proximal 5/5, distal 4/5, left upper and lower extremity 5/5.  Bulk was normal and fasciculations were absent.   Motor Tone - Muscle tone was assessed at the neck and appendages and was normal.  Reflexes - The patient's reflexes were 1+ in all extremities and she had equivocal pathological reflexes.  Sensory - Light touch, temperature/pinprick were assessed and were decreased on the right face.    Coordination - The patient had normal movements in the left hand, ataxic on the right FTN but not out of proportion to the weakness. Tremor was absent.  Gait and Station - not tested due to in Cardene drip.   ASSESSMENT/PLAN Ms. Brenda Blanchard is a 81 y.o. female with history of hypertension noncompliant with medication admitted Left thalamic hemorrhage extension to ventricles. Repeat CAT  scan showed stable hematoma Symptoms Stable  left basal ganglia hemorrhage with ventricle extension secondary to  uncontrolled hypertension and smalll vessel disease  repeat CT shows stable hematoma with ventricle extension  2D Echo - ejection fraction 50-55%. No cardiac source of emboli identified.  LDL  125, not at goal  HgbA1c - 6.6  SCD for VTE prophylaxis  Diet Heart with thin liquids.  aspirin 325 mg orally every day prior to admission, now on no antithrombotic secondary to [place].  Ongoing aggressive stroke risk factor management  Therapy recommendations:  CIR recommended  Disposition:  Pending  Hypertension, malignant  Home meds:   Atenolol and clonidine  Currently resumed home medication and add on lisinopril 10 for better blood pressure control  Stable  Taper off Cardene as possible  Patient counseled to be compliant with her  blood pressure medications  Hyperlipidemia  Home meds:  None   Currently on none  LDL 125, goal < 70  Hold off statin for now due to hemorrhage  Consider statin at discharge  Other Stroke Risk Factors  Advanced age  Other Active Problems  Transcortical motor aphasia  Anemia - recheck in a.m.  Hypokalemia - supplement    PLAN  CIR recommended.     Blood pressure well controlled on current regimen. May be able to decrease beta blocker  secondary to bradycardia.  Consider statin at time of discharge      Hospital day # 4    Await inpatient rehabilitation decision. Anticipate transfer to rehabilitation over the next couple of days  Antony Contras, [place] Pager: [phone] 11/15/2014 2:55 PM    To contact Stroke Continuity provider, please refer to [email or web address] After hours, contact General Neurology

## 2014-11-15 NOTE — Evaluation (Signed)
Occupational Therapy Evaluation Patient Details Name: Brenda Blanchard MRN: [id number] DOB: Mar 20, 1934 Today's Date: 11/15/2014    History of Present Illness Pt is 81 y/o female admitted 11/11/14 due to right sided weakness with MRI showing left basal ganglia ICH due to uncontrolled HTN.    Clinical Impression   Pt admitted with above. She demonstrates the below listed deficits and will benefit from continued OT to maximize safety and independence with BADLs.  Pt presents to OT with Rt hemiparesis, mild ataxia Rt. UE, impaired balance, mild Rt inattention, visual deficits, and cognitive deficits.  Based on notes in chart, pt appears significantly improved since yesterday.  Currently, she requires min - mod A for BADLs.   Feel she would be an excellent candidate for CIR, and anticipate good progress with BADLs.        Follow Up Recommendations  CIR;Supervision/Assistance - 24 hour    Equipment Recommendations  3 in 1 bedside comode;Tub/shower bench    Recommendations for Other Services Rehab consult     Precautions / Restrictions Precautions Precautions: Fall      Mobility Bed Mobility               General bed mobility comments: Pt sitting up in recliner  Transfers   Equipment used: 1 person hand held assist Transfers: Sit to/from [facility] Sit to Stand: Mod assist Stand pivot transfers: Mod assist       General transfer comment: Pt pushes to Lt.  Requires facilitation of Lt. hip for extension     Balance Overall balance assessment: Needs assistance Sitting-balance support: Feet supported Sitting balance-Leahy Scale: Fair Sitting balance - Comments: Pt initially leaning heavily to Lt, but while attempting to don/doff socks, able to normalize trunk movement and perform without LOB Postural control: Left lateral lean Standing balance support: Single extremity supported Standing balance-Leahy Scale: Poor                              ADL  Overall ADL's : Needs assistance/impaired Eating/Feeding: Set up;Sitting   Grooming: Wash/dry hands;Wash/dry face;Oral care;Brushing hair;Minimal assistance;Sitting   Upper Body Bathing: Moderate assistance;Sitting   Lower Body Bathing: Moderate assistance;Sit to/from stand Lower Body Bathing Details (indicate cue type and reason): Requirs assist for peri area and for standing  Upper Body Dressing : Moderate assistance;Sitting   Lower Body Dressing: Moderate assistance;Sit to/from stand Lower Body Dressing Details (indicate cue type and reason): assist for pulling pants over hips and for standing balance.  Pt able to don/doff socks with min guard assist sitting in recliner  Toilet Transfer: Moderate assistance;Stand-pivot;BSC   Toileting- Clothing Manipulation and Hygiene: Maximal assistance;Sit to/from stand       Functional mobility during ADLs: Moderate assistance General ADL Comments: Pt requires assist for balance and Rt. UE weakness     Vision Eye Alignment: Within Functional Limits   Ocular Range of Motion: Within Functional Limits Tracking/Visual Pursuits: Other (comment)         Additional Comments: Pt with very mild nystagmus noted and incordination of pursuits greater on the Lt inferior quadrant - ? component of apraxia   Perception Perception Perception Tested?: Yes Perception Deficits: Inattention/neglect Inattention/Neglect: Does not attend to right visual field Spatial deficits: very mild Rt inattention    Praxis Praxis Praxis tested?: Within functional limits    Pertinent Vitals/Pain Pain Assessment: No/denies pain     Hand Dominance Right   Extremity/Trunk Assessment Upper Extremity Assessment Upper  Extremity Assessment: RUE deficits/detail RUE Deficits / Details: Rt. UE grossly 2/5 - 3-/5.  Pt with mild ataxia Rt. UE and demonstrates motor impersistance.  RUE Sensation: decreased proprioception RUE Coordination: decreased fine motor;decreased  gross motor   Lower Extremity Assessment Lower Extremity Assessment: Defer to PT evaluation   Cervical / Trunk Assessment Cervical / Trunk Assessment: Other exceptions Cervical / Trunk Exceptions: Pt with left lateral lean with active lateral flexion of Lt trunk.  When engaged in bil. UE activity (donning socks), pt activates Rt. trunk spontaneously    Communication Communication Communication: No difficulties   Cognition Arousal/Alertness: Awake/alert Behavior During Therapy: WFL for tasks assessed/performed Overall Cognitive Status: Impaired/Different from baseline Area of Impairment: Orientation;Attention;Awareness;Problem solving Orientation Level: Disoriented to;Time Current Attention Level: Selective       Awareness: Intellectual Problem Solving: Difficulty sequencing;Requires verbal cues;Requires tactile cues General Comments: Pt is disoriented to the year, but oriented to all else.  She is able to state her deficits, but not yet able to explain how they impact her.      General Comments       Exercises       Shoulder Instructions      Home Living Family/patient expects to be discharged to:: Private residence Living Arrangements: Alone   Type of Home: House Home Access: Stairs to enter Technical brewer of Steps: 2 Entrance Stairs-Rails: Right Home Layout: One level               Home Equipment: None   Additional Comments: Pt was independent and driving prior to admission      Prior Functioning/Environment Level of Independence: Independent             OT Diagnosis: Generalized weakness;Cognitive deficits;Disturbance of vision;Hemiplegia dominant side;Ataxia;Apraxia   OT Problem List: Decreased strength;Decreased range of motion;Decreased activity tolerance;Impaired balance (sitting and/or standing);Impaired vision/perception;Decreased coordination;Decreased cognition;Decreased safety awareness;Decreased knowledge of use of DME or  AE;Impaired sensation;Obesity;Impaired UE functional use   OT Treatment/Interventions: Self-care/ADL training;Neuromuscular education;DME and/or AE instruction;Therapeutic activities;Cognitive remediation/compensation;Visual/perceptual remediation/compensation;Patient/family education;Balance training    OT Goals(Current goals can be found in the care plan section) Acute Rehab OT Goals Patient Stated Goal: to get better  OT Goal Formulation: With patient Time For Goal Achievement: 11/29/14 Potential to Achieve Goals: Good ADL Goals Pt Will Perform Grooming: with min assist;standing Pt Will Perform Upper Body Bathing: with supervision;with set-up;sitting Pt Will Perform Lower Body Bathing: with min assist;sit to/from stand Pt Will Perform Upper Body Dressing: with set-up;with supervision;sitting Pt Will Perform Lower Body Dressing: with min assist;sit to/from stand Pt Will Transfer to Toilet: with min assist;ambulating;regular height toilet;bedside commode;grab bars Pt Will Perform Toileting - Clothing Manipulation and hygiene: with min assist;sit to/from stand Additional ADL Goal #1: Pt will feed self with Rt. UE Additional ADL Goal #2: Pt will locate all items needed for BADLs with no verbal cues.   OT Frequency: Min 2X/week   Barriers to D/C: Decreased caregiver support          Co-evaluation              End of Session Nurse Communication: Mobility status  Activity Tolerance: Patient tolerated treatment well Patient left: in chair;with call bell/phone within reach;with chair alarm set   Time: 5784-6962 OT Time Calculation (min): 23 min Charges:  OT General Charges $OT Visit: 1 Procedure OT Evaluation $Initial OT Evaluation Tier I: 1 Procedure OT Treatments $Self Care/Home Management : 8-22 mins G-Codes:    Claudine Stallings M Nov 21, 2014, 11:54 AM

## 2014-11-16 ENCOUNTER — Encounter (HOSPITAL_COMMUNITY): Payer: Self-pay

## 2014-11-16 ENCOUNTER — Inpatient Hospital Stay (HOSPITAL_COMMUNITY): Payer: Medicare Other

## 2014-11-16 ENCOUNTER — Inpatient Hospital Stay (HOSPITAL_COMMUNITY)
Admission: RE | Admit: 2014-11-16 | Discharge: 2014-12-04 | DRG: 056 | Disposition: A | Discharge status: Home Health | Payer: Medicare Other | Source: Intra-hospital | Attending: Physical Medicine & Rehabilitation | Admitting: Physical Medicine & Rehabilitation

## 2014-11-16 ENCOUNTER — Encounter (HOSPITAL_COMMUNITY): Payer: Self-pay | Admitting: Physical Medicine and Rehabilitation

## 2014-11-16 DIAGNOSIS — G8191 Hemiplegia, unspecified affecting right dominant side: Secondary | ICD-10-CM

## 2014-11-16 DIAGNOSIS — Z602 Problems related to living alone: Secondary | ICD-10-CM | POA: Diagnosis present

## 2014-11-16 DIAGNOSIS — I69151 Hemiplegia and hemiparesis following nontraumatic intracerebral hemorrhage affecting right dominant side: Principal | ICD-10-CM

## 2014-11-16 DIAGNOSIS — R001 Bradycardia, unspecified: Secondary | ICD-10-CM | POA: Diagnosis present

## 2014-11-16 DIAGNOSIS — R600 Localized edema: Secondary | ICD-10-CM | POA: Diagnosis not present

## 2014-11-16 DIAGNOSIS — I69193 Ataxia following nontraumatic intracerebral hemorrhage: Secondary | ICD-10-CM | POA: Diagnosis not present

## 2014-11-16 DIAGNOSIS — G936 Cerebral edema: Secondary | ICD-10-CM | POA: Diagnosis present

## 2014-11-16 DIAGNOSIS — I69120 Aphasia following nontraumatic intracerebral hemorrhage: Secondary | ICD-10-CM

## 2014-11-16 DIAGNOSIS — R062 Wheezing: Secondary | ICD-10-CM | POA: Diagnosis present

## 2014-11-16 DIAGNOSIS — R197 Diarrhea, unspecified: Secondary | ICD-10-CM | POA: Diagnosis present

## 2014-11-16 DIAGNOSIS — R200 Anesthesia of skin: Secondary | ICD-10-CM | POA: Insufficient documentation

## 2014-11-16 DIAGNOSIS — I618 Other nontraumatic intracerebral hemorrhage: Secondary | ICD-10-CM

## 2014-11-16 DIAGNOSIS — Z9114 Patient's other noncompliance with medication regimen: Secondary | ICD-10-CM | POA: Diagnosis present

## 2014-11-16 DIAGNOSIS — J309 Allergic rhinitis, unspecified: Secondary | ICD-10-CM | POA: Diagnosis present

## 2014-11-16 DIAGNOSIS — I10 Essential (primary) hypertension: Secondary | ICD-10-CM | POA: Diagnosis present

## 2014-11-16 DIAGNOSIS — I69192 Facial weakness following nontraumatic intracerebral hemorrhage: Secondary | ICD-10-CM | POA: Diagnosis not present

## 2014-11-16 DIAGNOSIS — R339 Retention of urine, unspecified: Secondary | ICD-10-CM | POA: Diagnosis present

## 2014-11-16 DIAGNOSIS — E785 Hyperlipidemia, unspecified: Secondary | ICD-10-CM | POA: Diagnosis present

## 2014-11-16 DIAGNOSIS — I619 Nontraumatic intracerebral hemorrhage, unspecified: Secondary | ICD-10-CM | POA: Diagnosis present

## 2014-11-16 MED ORDER — PROCHLORPERAZINE EDISYLATE 5 MG/ML IJ SOLN
5.0000 mg | Freq: Four times a day (QID) | INTRAMUSCULAR | Status: DC | PRN
  Filled 2014-11-16: qty 2

## 2014-11-16 MED ORDER — BISACODYL 10 MG RE SUPP
10.0000 mg | Freq: Every day | RECTAL | Status: DC | PRN
  Filled 2014-11-16: qty 1

## 2014-11-16 MED ORDER — SENNOSIDES-DOCUSATE SODIUM 8.6-50 MG PO TABS
1.0000 | ORAL_TABLET | Freq: Every evening | ORAL | Status: DC | PRN
  Administered 2014-11-28: 1 via ORAL
  Filled 2014-11-16 (×2): qty 1

## 2014-11-16 MED ORDER — PROCHLORPERAZINE MALEATE 5 MG PO TABS
5.0000 mg | ORAL_TABLET | Freq: Four times a day (QID) | ORAL | Status: DC | PRN
  Filled 2014-11-16: qty 2

## 2014-11-16 MED ORDER — FLUTICASONE PROPIONATE 50 MCG/ACT NA SUSP
1.0000 | Freq: Every day | NASAL | Status: DC
  Administered 2014-11-18 – 2014-12-04 (×10): 1 via NASAL
  Filled 2014-11-16: qty 16

## 2014-11-16 MED ORDER — ALBUTEROL SULFATE HFA 108 (90 BASE) MCG/ACT IN AERS: 1.0000 | INHALATION_SPRAY | RESPIRATORY_TRACT | Status: DC | PRN

## 2014-11-16 MED ORDER — ACETAMINOPHEN 325 MG PO TABS: 325.0000 mg | ORAL_TABLET | ORAL | Status: DC | PRN

## 2014-11-16 MED ORDER — GUAIFENESIN-DM 100-10 MG/5ML PO SYRP: 5.0000 mL | ORAL_SOLUTION | Freq: Four times a day (QID) | ORAL | Status: DC | PRN

## 2014-11-16 MED ORDER — ALUM & MAG HYDROXIDE-SIMETH 200-200-20 MG/5ML PO SUSP: 30.0000 mL | ORAL | Status: DC | PRN

## 2014-11-16 MED ORDER — CLONIDINE HCL 0.3 MG PO TABS
0.3000 mg | ORAL_TABLET | Freq: Two times a day (BID) | ORAL | Status: DC
  Administered 2014-11-16 – 2014-11-23 (×15): 0.3 mg via ORAL
  Filled 2014-11-16 (×18): qty 1

## 2014-11-16 MED ORDER — ATENOLOL 50 MG PO TABS
50.0000 mg | ORAL_TABLET | Freq: Every day | ORAL | Status: DC
  Administered 2014-11-17 – 2014-11-29 (×13): 50 mg via ORAL
  Filled 2014-11-16 (×15): qty 1

## 2014-11-16 MED ORDER — PANTOPRAZOLE SODIUM 40 MG PO TBEC
40.0000 mg | DELAYED_RELEASE_TABLET | Freq: Every day | ORAL | Status: DC
  Administered 2014-11-16 – 2014-12-03 (×18): 40 mg via ORAL
  Filled 2014-11-16 (×20): qty 1

## 2014-11-16 MED ORDER — ENOXAPARIN SODIUM 40 MG/0.4ML ~~LOC~~ SOLN
40.0000 mg | SUBCUTANEOUS | Status: DC
  Administered 2014-11-17 – 2014-12-02 (×16): 40 mg via SUBCUTANEOUS
  Filled 2014-11-16 (×18): qty 0.4

## 2014-11-16 MED ORDER — PROCHLORPERAZINE 25 MG RE SUPP
12.5000 mg | Freq: Four times a day (QID) | RECTAL | Status: DC | PRN
  Filled 2014-11-16: qty 1

## 2014-11-16 MED ORDER — ALBUTEROL SULFATE (2.5 MG/3ML) 0.083% IN NEBU: 2.5000 mg | INHALATION_SOLUTION | RESPIRATORY_TRACT | Status: DC | PRN

## 2014-11-16 MED ORDER — FLEET ENEMA 7-19 GM/118ML RE ENEM: 1.0000 | ENEMA | Freq: Once | RECTAL | Status: AC | PRN

## 2014-11-16 NOTE — Progress Notes (Signed)
Speech Language Pathology Treatment: Cognitive-Linquistic  Patient Details Name: Syerra Abdelrahman MRN: [id number] DOB: 1934/08/04 Today's Date: 11/16/2014 Time: 1445-1500 SLP Time Calculation (min) (ACUTE ONLY): 15 min  Assessment / Plan / Recommendation Clinical Impression  Aphasia has improved. No neologisms today or dysnomia. SLP introduced word finding strategies for implementation with examples provided. Pt able to follow 2 step directions with 100%; wrote name with increased legibility. Required mod visual and verbal cues to recall address and phone #. Pt transferring to CIR today. Continue ST   HPI HPI: 81 y.o. female hx of HTN uncompliant with meds admitted with sudden onset of right-sided weakness and facial droop. Head CT evolving LEFT thalamus intraparenchymal 2 cm hematoma with intraventricular extension.   Pertinent Vitals Pain Assessment: No/denies pain  SLP Plan  Continue with current plan of care    Recommendations                Oral Care Recommendations: Oral care BID Follow up Recommendations: Inpatient Rehab Plan: Continue with current plan of care    GO     Houston Siren 11/16/2014, 3:09 PM

## 2014-11-16 NOTE — Progress Notes (Signed)
Rehab admissions - Evaluated for possible admission.  I met with patient and gave her rehab brochures.  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  She lives alone, has 2 sons in [place] and a sister locally.  She would like to have here rehab here in the hospital.  I will attempt to verify insurance and then see about admission to inpatient rehab.  Call me for questions.  #[phone]

## 2014-11-16 NOTE — Progress Notes (Signed)
Report given to Anderson Malta, [place] RN.  Will transport pt to [facility] with nurse tech  Angeline Slim I 11/16/2014 5:10 PM
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
## 2014-11-16 NOTE — Progress Notes (Signed)
STROKE TEAM PROGRESS NOTE   HISTORY OF PRESENT ILLNESS Brenda Blanchard is an 81 y.o. female with a past medical history significant for untreated HTN for quite some time, brought in via EMS due to acute onset of right hemiparesis, right face weakness. As per EMS, she was in the bathroom getting ready and had a sudden onset on right sided weakness and facial droop. SBP>200 upon EMS arrival. She reportedly took 2 tabs aspirin before calling EMS. Initial evaluation in the ED demonstrated a right hemiparesis with mild right face drop but preserved mental status. Received 10 mg IV labetalol. CT brain was reviewed by myself and showed a 20 x 22 mm area of hyperdense hemorrhage in the superior left thalamus, tracking into the posterior left corona radiata. Estimated intra-axial hemorrhage volume is 7 mL. There is intraventricular extension, with a small volume of intraventricular hemorrhage. No ventriculomegaly. Trace rightward midline shift.  Denies HA, vertigo, double vision, slurred speech, or visual disturbances. PTT 29, INR 1.09, platelets 187.  Date last known well: 11/11/14 Time last known well: 1:50 PM tPA Given: no ,ICH   SUBJECTIVE (INTERVAL HISTORY) No Family member at the bedside. The patient is without complaints.await inpatient rehab transfer   OBJECTIVE Temp:  [98.3 F (36.8 C)-99.3 F (37.4 C)] 98.8 F (37.1 C) (02/09 1502) Pulse Rate:  [50-106] 55 (02/09 1502) Cardiac Rhythm:  [-]  Resp:  [18-20] 20 (02/09 1502) BP: (131-168)/(56-90) 168/72 mmHg (02/09 1502) SpO2:  [96 %-99 %] 97 % (02/09 1502)  No results for input(s): GLUCAP in the last 168 hours.  Recent Labs Lab 11/11/14 1551 11/11/14 1557 11/12/14 0659 11/13/14 0225 11/14/14 0633 11/15/14 0710  NA 143 143 141 139 138 139  K 3.7 3.7 3.5 3.4* 3.2* 3.5  CL 112 108 112 110 110 112  CO2 21  --  20 20 21 21   GLUCOSE 89 96 120* 101* 105* 103*  BUN 17 22 12 12 10 9   CREATININE 1.18* 1.10 0.84 0.85 0.86 0.95  CALCIUM  9.0  --  8.5 8.2* 8.1* 8.1*    Recent Labs Lab 11/11/14 1551  AST 29  ALT 9  ALKPHOS 88  BILITOT 0.4  PROT 7.5  ALBUMIN 4.0    Recent Labs Lab 11/11/14 1551 11/11/14 1557 11/12/14 0659 11/13/14 0225 11/14/14 0633 11/15/14 0710  WBC 7.0  --  8.0 9.9 8.7 7.3  NEUTROABS 4.6  --   --   --   --   --   HGB 12.6 14.3 11.7* 11.4* 10.3* 9.9*  HCT 39.4 42.0 35.4* 35.5* 32.0* 30.7*  MCV 80.6  --  79.4 80.1 79.4 81.2  PLT 187  --  211 197 175 159   No results for input(s): CKTOTAL, CKMB, CKMBINDEX, TROPONINI in the last 168 hours. No results for input(s): LABPROT, INR in the last 72 hours. No results for input(s): COLORURINE, LABSPEC, [place], GLUCOSEU, HGBUR, BILIRUBINUR, KETONESUR, PROTEINUR, UROBILINOGEN, NITRITE, LEUKOCYTESUR in the last 72 hours.  Invalid input(s): APPERANCEUR     Component Value Date/Time   CHOL 203* 11/12/2014 0655   TRIG 54 11/12/2014 0655   HDL 67 11/12/2014 0655   CHOLHDL 3.0 11/12/2014 0655   VLDL 11 11/12/2014 0655   LDLCALC 125* 11/12/2014 0655   Lab Results  Component Value Date   HGBA1C 6.6* 11/12/2014      Component Value Date/Time   LABOPIA NONE DETECTED 11/11/2014 1641   COCAINSCRNUR NONE DETECTED 11/11/2014 1641   LABBENZ NONE DETECTED 11/11/2014 1641   AMPHETMU  NONE DETECTED 11/11/2014 1641   THCU NONE DETECTED 11/11/2014 1641   LABBARB NONE DETECTED 11/11/2014 1641     Recent Labs Lab 11/11/14 1551  ETH <5    I have personally reviewed the radiological images below and agree with the radiology interpretations.  Ct Head Wo Contrast 11/12/2014    Evolving LEFT thalamus intraparenchymal 2 cm hematoma with intraventricular extension. No hydrocephalus.  Mild white matter changes can be seen with chronic small vessel ischemic disease. Involutional changes.      Ct Head Wo Contrast 11/11/2014    1. Acute intra-axial hemorrhage measuring 2 cm. Favor hypertensive/small vessel disease related. Small volume intraventricular extension.  However, no ventriculomegaly or significant intracranial mass effect.  2. Underlying chronic white matter changes.    Carotid Doppler  not ordered  2D Echocardiogram   11/12/2014 Study Conclusions - Left ventricle: The cavity size was normal. Wall thickness was increased in a pattern of moderate LVH. Systolic function was normal. The estimated ejection fraction was in the range of 55% to 60%. Wall motion was normal; there were no regional wall motion abnormalities. Doppler parameters are consistent with abnormal left ventricular relaxation (grade 1 diastolic dysfunction). - Left atrium: The atrium was moderately dilated. - Atrial septum: There was an atrial septal aneurysm. - Pericardium, extracardiac: A trivial pericardial effusion was identified. Impressions: - Normal LV function; moderate LVH; grade 1 diastolic dysfunction; moderate LAE; atrial septal aneurysm.   PHYSICAL EXAM  Temp:  [98.3 F (36.8 C)-99.3 F (37.4 C)] 98.8 F (37.1 C) (02/09 1502) Pulse Rate:  [50-106] 55 (02/09 1502) Resp:  [18-20] 20 (02/09 1502) BP: (131-168)/(56-90) 168/72 mmHg (02/09 1502) SpO2:  [96 %-99 %] 97 % (02/09 1502)  General - Well nourished, well developed, in no apparent distress.  Ophthalmologic - fundi not visualized due to eye movement.  Cardiovascular - Regular rate and rhythm with no murmur.  Neck - supple, no carotid bruits  Mental Status -  Awake alert and orientation to self and place, not oriented to time, age, people  Speech mildly nonfluent able to repeat, but word salad, impaired on naming parts of objects. Follows midline and the peripheral simple commands, not able to follow complex commands.   Cranial Nerves II - XII - II - Visual field intact OU. III, IV, VI - Extraocular movements intact. V - Facial sensation intact bilaterally. VII - mild right lower facial droop. VIII - Hearing & vestibular intact bilaterally. X - Palate elevates  symmetrically. XI - Chin turning & shoulder shrug intact bilaterally. XII - Tongue protrusion intact.  Motor Strength - The patient's strength was 4-/5 right upper extremity with pronator drift was absent, right lower extremity proximal 5/5, distal 4/5, left upper and lower extremity 5/5.  Bulk was normal and fasciculations were absent.   Motor Tone - Muscle tone was assessed at the neck and appendages and was normal.  Reflexes - The patient's reflexes were 1+ in all extremities and she had equivocal pathological reflexes.  Sensory - Light touch, temperature/pinprick were assessed and were decreased on the right face.    Coordination - The patient had normal movements in the left hand, ataxic on the right FTN but not out of proportion to the weakness. Tremor was absent.  Gait and Station - not tested due to in Cardene drip.   ASSESSMENT/PLAN Ms. Aniyiah Golliday is a 81 y.o. female with history of hypertension noncompliant with medication admitted Left thalamic hemorrhage extension to ventricles. Repeat CAT scan showed stable hematoma  Symptoms Stable  left basal ganglia hemorrhage with ventricle extension secondary to  uncontrolled hypertension and smalll vessel disease  repeat CT showed stable hematoma with ventricle extension  2D Echo - ejection fraction 50-55%. No cardiac source of emboli identified.  LDL  125, not at goal  HgbA1c - 6.6  SCD for VTE prophylaxis  Diet Heart with thin liquids.  aspirin 325 mg orally every day prior to admission, now on no antithrombotic secondary to [place].  Ongoing aggressive stroke risk factor management  Therapy recommendations:  CIR recommended  Disposition:  CLR  Hypertension, malignant  Home meds:   Atenolol and clonidine  Currently resumed home medication and add on lisinopril 10 for better blood pressure control  Stable  Taper off Cardene as possible  Patient counseled to be compliant with her blood pressure  medications  Hyperlipidemia  Home meds:  None   Currently on none  LDL 125, goal < 70  Hold off statin for now due to hemorrhage  Consider statin at discharge  Other Stroke Risk Factors  Advanced age  Other Active Problems  Transcortical motor aphasia  Anemia - recheck in a.m.  Hypokalemia - supplement    PLAN  CIR recommended.    Blood pressure well controlled on current regimen.      Hospital day # 5    Await inpatient rehabilitation decision. Anticipate transfer to rehabilitation over the next couple of days  Antony Contras, [place] Pager: [phone] 11/16/2014 3:06 PM    To contact Stroke Continuity provider, please refer to [email or web address] After hours, contact General Neurology

## 2014-11-16 NOTE — Progress Notes (Signed)
Pt transported out of unit per bed with belongings in bag in bed with RN and nurse tech, Lattie Haw. No acute distress noted.   Angeline Slim I 11/16/2014 5:47 PM

## 2014-11-16 NOTE — Progress Notes (Signed)
Physical Therapy Treatment Patient Details Name: Anistyn Graddy MRN: [id number] DOB: [birth year] Today's Date: 11/16/2014    History of Present Illness Pt is 81 y/o female admitted 11/11/14 due to right sided weakness with MRI showing left basal ganglia ICH due to uncontrolled HTN.     PT Comments    Pt. Was very compliant and aggreeable to work on her posture in sitting in standing. The mirror technique worked well and she was aware and able to right herself and hold herself upright during functional activity. Pt. Is a good candidate for CIR for her ongoing balance deficits and strengthening. Will try ambulation next session,   Follow Up Recommendations  CIR;Supervision/Assistance - 24 hour     Equipment Recommendations       Recommendations for Other Services Rehab consult     Precautions / Restrictions Precautions Precautions: Fall Restrictions Weight Bearing Restrictions: No    Mobility  Bed Mobility Overal bed mobility: +2 for physical assistance;Needs Assistance             General bed mobility comments: Pt sitting up in recliner upon PT arrival.   Transfers Overall transfer level: Needs assistance Equipment used: Rolling walker (2 wheeled) Transfers: Sit to/from Stand Sit to Stand: Mod assist         General transfer comment: Mod A to stand from chair x3 with manual cues for hand/foot placement and for anterior translation. Pt attempting to push up with left forearm. Manual cues to place hands on walker handles and on bed rail during pre gait. Pt pushes to left during standing with trunk rotation.  Ambulation/Gait                 Stairs            Wheelchair Mobility    Modified Rankin (Stroke Patients Only)       Balance Overall balance assessment: Needs assistance Sitting-balance support: Feet supported;No upper extremity supported Sitting balance-Leahy Scale: Fair Sitting balance - Comments: Pt with lean towards left however able to  self correct to midline with verbal/mirroring cues. Impaired trunk control with LOB posteriorly when sitting without back support. Postural control: Left lateral lean Standing balance support: Bilateral upper extremity supported Standing balance-Leahy Scale: Poor Standing balance comment: difficulty with shifting weight and lifting up feet in marching inside the walker, improved standing at bedside with bed rails                    Cognition Arousal/Alertness: Awake/alert Behavior During Therapy: WFL for tasks assessed/performed Overall Cognitive Status: Impaired/Different from baseline Area of Impairment: Following commands;Attention   Current Attention Level: Selective   Following Commands: Follows one step commands inconsistently     Problem Solving: Difficulty sequencing;Requires verbal cues;Requires tactile cues General Comments: Pt. able to identify some of her deficits but not all especially her leaning to the left, used the mirror for visual cues for correction    Exercises Other Exercises Other Exercises: Standing balance with UEs on bed rail providing max manual cues for midline positioning performing weight shifting while maintaining posture and mini marches. Pt not able to maintain cervical and trunk alignment without manual cues. ~2 minutes. Other Exercises: seated D1 PNF pattern with right UE x 5    General Comments        Pertinent Vitals/Pain Pain Assessment: 0-10 Pain Score: 5  Pain Location: right knee Pain Descriptors / Indicators: Aching Pain Intervention(s): Patient requesting pain meds-RN notified    Home Living  Prior Function            PT Goals (current goals can now be found in the care plan section) Progress towards PT goals: Progressing toward goals    Frequency  Min 4X/week    PT Plan Current plan remains appropriate    Co-evaluation             End of Session Equipment Utilized During  Treatment: Gait belt Activity Tolerance: Patient tolerated treatment well Patient left: in chair;with call bell/phone within reach;with chair alarm set     Time: 4628-6381 PT Time Calculation (min) (ACUTE ONLY): 30 min  Charges:                       G Codes:      Jodi Geralds, SPTA 11/16/2014, 10:39 AM

## 2014-11-16 NOTE — PMR Pre-admission (Signed)
PMR Admission Coordinator Pre-Admission Assessment  Patient: Brenda Blanchard is an 81 y.o., female MRN: [id number] DOB: 1933-12-15 Height: 5\' 5"  (165.1 cm) Weight: 86.183 kg (190 lb)              Insurance Information HMO: No    PPO:       PCP:       IPA:       80/20:       OTHER:   PRIMARY: Medicare A/B      Policy#: [id number]      Subscriber: Rubie Maid CM Name:        Phone#:       Fax#:   Pre-Cert#:        Employer: Not employed Benefits:  Phone #:       Name: Checked in [place]. Date:  10/08/00     Deduct: $1288      Out of Pocket Max: none      Life Max: unlimited CIR: 100%      SNF: 100 days Outpatient: 80%     Co-Pay: 20% Home Health: 100%      Co-Pay: none DME: 80%     Co-Pay: 20% Providers: patient's choice  SECONDARY:  Tricare      Policy#: [id number]      Subscriber: Rubie Maid CM Name:        Phone#:       Fax#:   Pre-Cert#:        Employer: Not employed Benefits:  Phone #: [phone]     Name: Automated Eff. Date: Eligible 11/16/14     Deduct:        Out of Pocket Max:        Life Max:   CIR:        SNF:   Outpatient:       Co-Pay:   Home Health:        Co-Pay:   DME:       Co-Pay:    Emergency Contact Information Contact Information    Name Relation Home Work Mobile   [place] Son   [phone]   Aundra Dubin   [phone]     Current Medical History  Patient Admitting Diagnosis: Left thalamic to internal capsule ICH  History of Present Illness: A 81 y.o. female with history of untreated HTN who was admitted on 11/11/14 with acute onset of right sided weakness, difficulty talking and right facial droop. SBP>200 at admission and patient treated with IV labetalol. CT head done revealing 20 x 22 mm area of hyperdense hemorrhage in the superior left thalamus, tracking into the posterior left corona radiata with intraventricular extension likely due to hypertensive bleed. Neurology recommended BP management with cardene drip and follow up CCT  with stable bleed and no significant change in vasogenic edema. 2D echo with moderate LVH with EF 55-60% and trivial pericardial effusion. Patient with resultant right hemiparesis with mild ataxia, right inattention with visual deficits as well as transcortical aphasia. PT/OT evaluations done yesterday and CIR recommended by MD and rehab team.  Total: 8=NIH  Past Medical History  Past Medical History  Diagnosis Date  . Hypertension     Family History  family history is not on file.  Prior Rehab/Hospitalizations:  No previous rehab admissions.   Current Medications   Current facility-administered medications:  .   stroke: mapping our early stages of recovery book, , Does not apply, Once, Amie Portland, MD .  acetaminophen (TYLENOL) tablet 650 mg, 650  mg, Oral, Q4H PRN **OR** acetaminophen (TYLENOL) suppository 650 mg, 650 mg, Rectal, Q4H PRN, Amie Portland, MD .  atenolol (TENORMIN) tablet 100 mg, 100 mg, Oral, Daily, Candida Peeling, PA-C, 100 mg at 11/16/14 1041 .  cloNIDine (CATAPRES) tablet 0.3 mg, 0.3 mg, Oral, BID, Candida Peeling, PA-C, 0.3 mg at 11/16/14 1042 .  labetalol (NORMODYNE,TRANDATE) injection 10-40 mg, 10-40 mg, Intravenous, Q10 min PRN, Amie Portland, MD, 20 mg at 11/13/14 1326 .  pantoprazole (PROTONIX) EC tablet 40 mg, 40 mg, Oral, QHS, Rosalin Hawking, MD, 40 mg at 11/15/14 2134 .  senna-docusate (Senokot-S) tablet 1 tablet, 1 tablet, Oral, BID, Amie Portland, MD, 1 tablet at 11/15/14 2134  Patients Current Diet: Diet Heart  Precautions / Restrictions Precautions Precautions: Fall Restrictions Weight Bearing Restrictions: No   Prior Activity Level Community (5-7x/wk): Went out 3-5 X a week   Development worker, international aid / Paramedic Devices/Equipment: None Home Equipment: None  Prior Functional Level Prior Function Level of Independence: Independent  Current Functional Level Cognition  Arousal/Alertness:  Awake/alert Overall Cognitive Status: Impaired/Different from baseline Current Attention Level: Selective Orientation Level: Oriented to person, Oriented to place, Oriented to situation Following Commands: Follows one step commands inconsistently General Comments: Pt. able to identify some of her deficits but not all especially her leaning to the left, used the mirror for visual cues for correction Attention: Sustained Sustained Attention: Appears intact Memory:  (to assess further) Awareness: Appears intact Problem Solving: Impaired Problem Solving Impairment: Functional basic Safety/Judgment:  (questionable)    Extremity Assessment (includes Sensation/Coordination)  Upper Extremity Assessment: RUE deficits/detail RUE Deficits / Details: Rt. UE grossly 2/5 - 3-/5.  Pt with mild ataxia Rt. UE and demonstrates motor impersistance.  RUE Sensation: decreased proprioception RUE Coordination: decreased fine motor, decreased gross motor  Lower Extremity Assessment: Defer to PT evaluation RLE Deficits / Details: 3+/5 gross  RLE Sensation: decreased light touch, decreased proprioception RLE Coordination: decreased gross motor    ADLs  Overall ADL's : Needs assistance/impaired Eating/Feeding: Set up, Sitting Grooming: Wash/dry hands, Wash/dry face, Oral care, Brushing hair, Minimal assistance, Sitting Upper Body Bathing: Moderate assistance, Sitting Lower Body Bathing: Moderate assistance, Sit to/from stand Lower Body Bathing Details (indicate cue type and reason): Requirs assist for peri area and for standing  Upper Body Dressing : Moderate assistance, Sitting Lower Body Dressing: Moderate assistance, Sit to/from stand Lower Body Dressing Details (indicate cue type and reason): assist for pulling pants over hips and for standing balance.  Pt able to don/doff socks with min guard assist sitting in recliner  Toilet Transfer: Moderate assistance, Stand-pivot, BSC Toileting- Clothing  Manipulation and Hygiene: Maximal assistance, Sit to/from stand Functional mobility during ADLs: Moderate assistance General ADL Comments: Pt requires assist for balance and Rt. UE weakness    Mobility  Overal bed mobility: +2 for physical assistance, Needs Assistance Bed Mobility: Supine to Sit Supine to sit: Mod assist, +2 for physical assistance, +2 for safety/equipment General bed mobility comments: Pt sitting up in recliner upon PT arrival.     Transfers  Overall transfer level: Needs assistance Equipment used: Rolling walker (2 wheeled) Transfers: Sit to/from Stand Sit to Stand: Mod assist Stand pivot transfers: Mod assist General transfer comment: Mod A to stand from chair x3 with manual cues for hand/foot placement and for anterior translation. Pt attempting to push up with left forearm. Manual cues to place hands on walker handles and on bed rail during pre gait. Pt pushes to  left during standing with trunk rotation.    Ambulation / Gait / Stairs / Wheelchair Mobility  Ambulation/Gait Ambulation/Gait assistance: Mod assist Ambulation Distance (Feet): 20 Feet Assistive device: Rolling walker (2 wheeled) Gait Pattern/deviations: Step-to pattern, Step-through pattern, Decreased step length - right, Decreased stride length, Narrow base of support, Trunk flexed (left trunk rotation) Gait velocity interpretation: Below normal speed for age/gender General Gait Details: Cues to increase step length on RLE to facilitate step through gait. Increased hip/knee flexion throughout gait requiring constant verbal/manual cues for upright posture and hip extension. Pt with left trunk rotation. Fatigues.    Posture / Balance Dynamic Sitting Balance Sitting balance - Comments: Pt with lean towards left however able to self correct to midline with verbal/mirroring cues. Impaired trunk control with LOB posteriorly when sitting without back support. Balance Overall balance assessment: Needs  assistance Sitting-balance support: Feet supported, No upper extremity supported Sitting balance-Leahy Scale: Fair Sitting balance - Comments: Pt with lean towards left however able to self correct to midline with verbal/mirroring cues. Impaired trunk control with LOB posteriorly when sitting without back support. Postural control: Left lateral lean Standing balance support: Bilateral upper extremity supported Standing balance-Leahy Scale: Poor Standing balance comment: difficulty with shifting weight and lifting up feet in marching inside the walker, improved standing at bedside with bed rails    Special needs/care consideration BiPAP/CPAP No CPM No Continuous Drip IV No Dialysis No       Life Vest No Oxygen No Special Bed No Trach Size No Wound Vac (area) No     Skin No                           Bowel mgmt: Last BM 11/16/14 Bladder mgmt: Voiding on BSC and in bathroom with assistance Diabetic mgmt No    Previous Home Environment Living Arrangements: Alone Type of Home: House Home Layout: One level Home Access: Stairs to enter Entrance Stairs-Rails: Right Entrance Stairs-Number of Steps: 2 [place]: No Additional Comments: Pt was independent and driving prior to admission  Discharge Living Setting Plans for Discharge Living Setting: Patient's home, Alone, House (Lives alone.) Type of Home at Discharge: House Discharge Home Layout: One level Discharge Home Access: Stairs to enter Entrance Stairs-Number of Steps: 2 steps Does the patient have any problems obtaining your medications?: No  Social/Family/Support Systems Patient Roles: Parent (Has 2 sons who live in [place].) Contact Information: Elliyah Liszewski - son [phone] Anticipated Caregiver: sons may come stay with patient after rehab and alternate caregiver roles Ability/Limitations of Caregiver: [name] and Remo Lipps live in [place].  Billy plans to come stay with patient for 1-2 weeks after rehab.  He  will contact his brother Remo Lipps to try to get his help as well.  Bayard Beaver, sister, lives locally but cannot be a caregiver. Caregiver Availability: Other (Comment) (Son planning to come for 1-2 weeks to stay.) Discharge Plan Discussed with Primary Caregiver: Yes Is Caregiver In Agreement with Plan?: Yes Does Caregiver/Family have Issues with Lodging/Transportation while Pt is in Rehab?: No  Goals/Additional Needs Patient/Family Goal for Rehab: PT mod I, OT Mod I to supervision and ST mod I to supervision goals Expected length of stay: 13-15 days Cultural Considerations: None Dietary Needs: Heart diet Equipment Needs: TBD Pt/Family Agrees to Admission and willing to participate: Yes Program Orientation Provided & Reviewed with Pt/Caregiver Including Roles  & Responsibilities: Yes  Decrease burden of Care through IP rehab admission: N/A  Possible need for SNF placement upon discharge: Not planned  Patient Condition: This patient's condition remains as documented in the consult dated 11/15/14, in which the Rehabilitation Physician determined and documented that the patient's condition is appropriate for intensive rehabilitative care in an inpatient rehabilitation facility. Will admit to inpatient rehab today.  Preadmission Screen Completed By:  Retta Diones, 11/16/2014 11:58 AM ______________________________________________________________________   Discussed status with Dr. Naaman Plummer on 11/16/14 at 1157 and received telephone approval for admission today.  Admission Coordinator:  Retta Diones, time1157/Date02/09/16
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
## 2014-11-16 NOTE — Progress Notes (Signed)
bladder scan revealed 253 cc of urine. Nurse notified NP, Ivin Booty with Dr. Leonie Man. Will monitor for orders/return call.   Angeline Slim I 11/16/2014 1:17 PM

## 2014-11-16 NOTE — Interval H&P Note (Signed)
Brenda Blanchard was admitted today to Inpatient Rehabilitation with the diagnosis of left thalamic to corona radiata ICH.  The patient's history has been reviewed, patient examined, and there is no change in status.  Patient continues to be appropriate for intensive inpatient rehabilitation.  I have reviewed the patient's chart and labs.  Questions were answered to the patient's satisfaction.  Jusitn Salsgiver T 11/16/2014, 7:27 PM

## 2014-11-16 NOTE — Progress Notes (Signed)
Patient arrived via bed. Alert and oriented vital signs stable. Admission book discussed, safety plan gone over all questions answered. Will continue to monitor. Jimmie Molly, RN

## 2014-11-17 ENCOUNTER — Inpatient Hospital Stay (HOSPITAL_COMMUNITY): Payer: Medicare Other | Admitting: Occupational Therapy

## 2014-11-17 ENCOUNTER — Inpatient Hospital Stay (HOSPITAL_COMMUNITY): Payer: Medicare Other | Admitting: Physical Therapy

## 2014-11-17 ENCOUNTER — Inpatient Hospital Stay (HOSPITAL_COMMUNITY): Payer: Medicare Other | Admitting: Speech Pathology

## 2014-11-17 DIAGNOSIS — G8190 Hemiplegia, unspecified affecting unspecified side: Secondary | ICD-10-CM

## 2014-11-17 DIAGNOSIS — G8191 Hemiplegia, unspecified affecting right dominant side: Secondary | ICD-10-CM

## 2014-11-17 DIAGNOSIS — I610 Nontraumatic intracerebral hemorrhage in hemisphere, subcortical: Secondary | ICD-10-CM

## 2014-11-17 NOTE — Patient Care Conference (Signed)
Inpatient RehabilitationTeam Conference and Plan of Care Update Date: 11/17/2014   Time: 11;20 AM    Patient Name: Brenda Blanchard      Medical Record Number: [id number]  Date of Birth: 06-22-1934 Sex: Female         Room/Bed: 4M02C/4M02C-01 Payor Info: Payor: MEDICARE / Plan: MEDICARE PART A AND B / Product Type: *No Product type* /    Admitting Diagnosis: L ICH  Admit Date/Time:  11/16/2014  5:42 PM Admission Comments: No comment available   Primary Diagnosis:  ICH (intracerebral hemorrhage) Principal Problem: ICH (intracerebral hemorrhage)  Patient Active Problem List   Diagnosis Date Noted  . Right hemiparesis 11/17/2014  . ICH (intracerebral hemorrhage) 11/16/2014  . Essential hypertension 11/16/2014  . Intracerebral hemorrhage of other cerebral location   . Stroke due to intracerebral hemorrhage 11/11/2014    Expected Discharge Date: Expected Discharge Date: 12/04/14  Team Members Present: Physician leading conference: Dr. Alysia Penna Social Worker Present: Ovidio Kin, LCSW Nurse Present: Elliot Cousin, RN PT Present: Raylene Everts, PT;Caroline Lacinda Axon, PT;Blair Hobble, PT OT Present: [name], OT SLP Present: Windell Moulding, SLP PPS Coordinator present : Daiva Nakayama, RN, CRRN     Current Status/Progress Goal Weekly Team Focus  Medical   mild aphasia, mild R HP  Home D/C  initiate therapy program   Bowel/Bladder   Patient retaining urine and requiring in and out caths; continent of bowel; LBM 2/7  Continent of bowel and bladder  Assist patient to [facility] or toilet for all toileting to encourage bladder emptying   Swallow/Nutrition/ Hydration     na        ADL's   mod assist stand pivot transfers, unable to assess dressing due to no clothes on eval, mod assist bathing  mod I self-care tasks, supervision higher level IADLs  transfers, sit <> stand, standing balance, RUE NMR, home making tasks   Mobility   Mod A bed mobility and transfers, +2A gait and stairs Mod I for  transfers and w/c mobility/management; supervision ambulation; min A stairs  R NMR, bed mobility, gait/transfer training, stair negotiation, initiate pt/family education   Communication   mild higher level word finding deficits in conversation   Mod I  education and carryover of compensatory strategies.    Safety/Cognition/ Behavioral Observations  mild higher level deficits for memory and functional problem solving   supervision   education and carryover of compensatory strategies    Pain   Denies  </=3  Assess pain qshift and PRN   Skin   No skin isssues noted  No new skin breakdown  Assess skin qshift and PRN; turn q2hr in bed      *See Care Plan and progress notes for long and short-term goals.  Barriers to Discharge: has a home in [place] and [place]    Possible Resolutions to Barriers:  Will set up     Discharge Planning/Teaching Needs:    Home with son who will be here for 1-2 weeks, otherwise sister can check in on her.  Needs to be mod/i level by discharge     Team Discussion:  New evaluations-goals mod/i wheelchair level, supervision ambulation. Retaining fluid-I & O cath will see if starts to void.  Sensation and perception affected by stroke.   Revisions to Treatment Plan:  New eval   Continued Need for Acute Rehabilitation Level of Care: The patient requires daily medical management by a physician with specialized training in physical medicine and rehabilitation for the following conditions: Daily  direction of a multidisciplinary physical rehabilitation program to ensure safe treatment while eliciting the highest outcome that is of practical value to the patient.: Yes Daily medical management of patient stability for increased activity during participation in an intensive rehabilitation regime.: Yes Daily analysis of laboratory values and/or radiology reports with any subsequent need for medication adjustment of medical intervention for : Neurological  problems  [name] 11/18/2014, 8:33 AM

## 2014-11-17 NOTE — Progress Notes (Signed)
Social Work Assessment and Plan Social Work Assessment and Plan  Patient Details  Name: Brenda Blanchard MRN: [id number] Date of Birth: [birth year]  Today's Date: 11/17/2014  Problem List:  Patient Active Problem List   Diagnosis Date Noted  . Right hemiparesis 11/17/2014  . ICH (intracerebral hemorrhage) 11/16/2014  . Essential hypertension 11/16/2014  . Intracerebral hemorrhage of other cerebral location   . Stroke due to intracerebral hemorrhage 11/11/2014   Past Medical History:  Past Medical History  Diagnosis Date  . Hypertension   . Endometrial cancer   . Seasonal allergies    Past Surgical History:  Past Surgical History  Procedure Laterality Date  . Abdominal hysterectomy  2013  . Tonsillectomy and adenoidectomy     Social History:  reports that she has never smoked. She does not have any smokeless tobacco history on file. She reports that she does not drink alcohol. Her drug history is not on file.  Family / Support Systems Marital Status: Widow/Widower Patient Roles: Parent, Other (Comment) (Sibling) Children: Eloy End  [phone]    Steven-son Other Supports: Aundra Dubin  [phone] Anticipated Caregiver: [name] coming to stay 1-2 weeks to assist pt, otherwise her sister can check on her but not provide care to pt Ability/Limitations of Caregiver: Both son's live in [place] is coming to assist for 1-2 weeks at discharge.   Caregiver Availability: Other (Comment) (Son's and sister still working on plan) Family Dynamics: Close knit family pt has homes in [place] which she spends 6 months of the year in each.  Her son's are in [place] and her sister is in [place].  Pt has always been very independent and taken care of herself and wants to again.   Social History Preferred language: English Religion:  Cultural Background: No issues Education: Secretary/administrator Educated Read: Yes Write: Yes Employment Status: Retired Freight forwarder  Issues: No issues Guardian/Conservator: None-according to MD pt is capable of making her own decisions while here.  Her sister will be here and support her.     Abuse/Neglect Physical Abuse: Denies Verbal Abuse: Denies Sexual Abuse: Denies Exploitation of patient/patient's resources: Denies Self-Neglect: Denies  Emotional Status Pt's affect, behavior adn adjustment status: Pt is motivated to improve and not happy she is in this situation.  She realizes the importance of taking her meds and plans to do so upon discharge from here. She is one to take care of herself and others and this is new for her.  She is trying to process all of this still. Recent Psychosocial Issues: Other health issues-was not taking her medicines has no MD in [facility] Pyschiatric History: No history deferred depression screen due to feeling sleepy today and tired from therapies.  She would benefit from Neuro-psych sometime thoughout her stay here.  Will monitor and provide support while here. Substance Abuse History: No issues  Patient / Family Perceptions, Expectations & Goals Pt/Family understanding of illness & functional limitations: Pt and sister are able to explain her stroke and deficts.  She has spoken wiht MD and feels her questions and concerns are being addressed.  She wants to get as high level as possible before leaving here. Premorbid pt/family roles/activities: Sister, Mother, Grandmother, Retiree, Home owner, church member, etc Anticipated changes in roles/activities/participation: resume Pt/family expectations/goals: Pt states: " I want to take care of myself before I leave here, my son will be here for only one week."  Sister states: " I hope she does well here."  [place]  Agencies: None Premorbid Home Care/DME Agencies: None Transportation available at discharge: family members Resource referrals recommended: Neuropsychology, Support group (specify)  Discharge  Planning Living Arrangements: Alone Support Systems: Children, Other relatives, Friends/neighbors, Social worker community Type of Residence: Private residence Insurance Resources: [employer], Multimedia programmer (specify) Sports administrator) Financial Resources: [place] Referred: No Living Expenses: Own Money Management: Patient Does the patient have any problems obtaining your medications?: Yes (Describe) (had no MD following so took no meds) Home Management: Patient Patient/Family Preliminary Plans: Return home with son for a couple of weeks, then sister will be checking on her and helping with home management.  Will contact pt's son to inform of team's recommendations and goals while here, so he can plan a flight here.  Pt has only been here one day so difficult to assess goals for discharge at this point. Social Work Anticipated Follow Up Needs: HH/OP, Support Group  Clinical Impression Pleasant female who is motivated to improve and reach a level where she can return home, family support, son will come here for 1-2 weeks to assist pt.  Sister is local and supportive but can not  Assist pt at home with any physical care needs.  Will set pt up with PCP prior to discharge.  Work on discharge needs.  [name] 11/17/2014, 3:48 PM

## 2014-11-17 NOTE — Progress Notes (Signed)
Meredith Staggers, MD Physician Signed Physical Medicine and Rehabilitation Consult Note 11/15/2014 12:36 PM  Related encounter: ED to Hosp-Admission (Discharged) from 11/11/2014 in [facility]        Physical Medicine and Rehabilitation Consult   Reason for Consult: Right sided weakness,  Referring Physician: Dr. Leonie Man.    HPI: Brenda Blanchard is a 81 y.o. female with history of untreated HTN who was admitted on 11/11/14 with acute onset of right sided weakness, difficulty talking and right facial droop. SBP>200 at admission and patient treated with IV labetalol. CT head done revealing 20 x 22 mm area of hyperdense hemorrhage in the superior left thalamus, tracking into the posterior left corona radiata with intraventricular extension likely due to hypertensive bleed. Neurology recommended BP management with cardene drip and follow up CCT with stable bleed and no significant change in vasogenic edema. 2D echo with moderate LVH with EF 55-60% and trivial pericardial effusion. Patient with resultant right hemiparesis with mild ataxia, right inattention with visual deficits as well as transcortical aphasia. PT/OT evaluations done yesterday and CIR recommended by MD and rehab team.   Review of Systems  HENT: Negative for hearing loss.  Eyes: Negative for blurred vision and double vision.  Respiratory: Negative for cough and hemoptysis.  Cardiovascular: Negative for chest pain and palpitations.  Gastrointestinal: Negative for abdominal pain.  Musculoskeletal: Positive for neck pain. Negative for back pain.  Neurological: Positive for sensory change and focal weakness. Negative for dizziness and headaches.      Past Medical History  Diagnosis Date  . Hypertension     History reviewed. No pertinent past surgical history.    History reviewed. No pertinent family history.    Social History: Lives alone but has a  nephew who's in and out. Spread out time between [place] and [place] Retired Engineer, structural. She reports that she has never smoked. She does not have any smokeless tobacco history on file. She reports that she does not drink alcohol. Her drug history is not on file.    Allergies: No Known Allergies    Medications Prior to Admission  Medication Sig Dispense Refill  . aspirin 325 MG tablet Take 325 mg by mouth every 6 (six) hours as needed for mild pain or moderate pain.    Marland Kitchen atenolol (TENORMIN) 100 MG tablet Take 100 mg by mouth daily.    . cloNIDine (CATAPRES) 0.3 MG tablet Take 0.3 mg by mouth 2 (two) times daily.      Home: Home Living Family/patient expects to be discharged to:: Private residence Living Arrangements: Alone Type of Home: House Home Access: Stairs to enter Technical brewer of Steps: 2 Entrance Stairs-Rails: Right Home Layout: One level Home Equipment: None Additional Comments: Pt was independent and driving prior to admission  Functional History: Prior Function Level of Independence: Independent Functional Status:  Mobility: Bed Mobility Overal bed mobility: +2 for physical assistance, Needs Assistance Bed Mobility: Supine to Sit Supine to sit: Mod assist, +2 for physical assistance, +2 for safety/equipment General bed mobility comments: Pt sitting up in recliner Transfers Overall transfer level: Needs assistance Equipment used: 1 person hand held assist Transfers: Sit to/from Stand, Stand Pivot Transfers Sit to Stand: Mod assist Stand pivot transfers: Mod assist General transfer comment: Pt pushes to Lt. Requires facilitation of Lt. hip for extension  Ambulation/Gait Ambulation/Gait assistance: (unable to ambulate at this time)    ADL: ADL Overall ADL's : Needs  assistance/impaired Eating/Feeding: Set up, Sitting Grooming: Wash/dry hands, Wash/dry face, Oral care, Brushing hair, Minimal assistance, Sitting Upper Body  Bathing: Moderate assistance, Sitting Lower Body Bathing: Moderate assistance, Sit to/from stand Lower Body Bathing Details (indicate cue type and reason): Requirs assist for peri area and for standing  Upper Body Dressing : Moderate assistance, Sitting Lower Body Dressing: Moderate assistance, Sit to/from stand Lower Body Dressing Details (indicate cue type and reason): assist for pulling pants over hips and for standing balance. Pt able to don/doff socks with min guard assist sitting in recliner  Toilet Transfer: Moderate assistance, Stand-pivot, BSC Toileting- Clothing Manipulation and Hygiene: Maximal assistance, Sit to/from stand Functional mobility during ADLs: Moderate assistance General ADL Comments: Pt requires assist for balance and Rt. UE weakness  Cognition: Cognition Overall Cognitive Status: Impaired/Different from baseline Arousal/Alertness: Awake/alert Orientation Level: Oriented to person, Oriented to place, Oriented to situation, Disoriented to time Attention: Sustained Sustained Attention: Appears intact Memory: (to assess further) Awareness: Appears intact Problem Solving: Impaired Problem Solving Impairment: Functional basic Safety/Judgment: (questionable) Cognition Arousal/Alertness: Awake/alert Behavior During Therapy: WFL for tasks assessed/performed Overall Cognitive Status: Impaired/Different from baseline Area of Impairment: Orientation, Attention, Awareness, Problem solving Orientation Level: Disoriented to, Time Current Attention Level: Selective Awareness: Intellectual Problem Solving: Difficulty sequencing, Requires verbal cues, Requires tactile cues General Comments: Pt is disoriented to the year, but oriented to all else. She is able to state her deficits, but not yet able to explain how they impact her.   Blood pressure 136/62, pulse 54, temperature 99 F (37.2 C), temperature source Oral, resp. rate 18, height 5\' 5"  (1.651 m), weight  86.183 kg (190 lb), SpO2 100 %. Physical Exam  Nursing note and vitals reviewed. Constitutional: She is oriented to person, place, and time. She appears well-developed and well-nourished.  HENT:  Head: Normocephalic and atraumatic.  Eyes: Conjunctivae are normal. Pupils are equal, round, and reactive to light.  Neck: Normal range of motion. Neck supple.  Cardiovascular: Normal rate and regular rhythm.  Respiratory: Effort normal and breath sounds normal.  GI: Soft. Bowel sounds are normal. She exhibits no distension. There is no tenderness.  Neurological: She is alert and oriented to person, place, and time.  Ataxic speech. Able to follow commands without difficulty. Right hemiparesis with ataxia and sensory deficits. RUE motor 3+ to4/5. RLE: 4 to 4+/ Shows good awareness and insight into deficits.  Skin: Skin is warm and dry.  Psychiatric: She has a normal mood and affect. Her behavior is normal.  Fair insight and awareness     Lab Results Last 24 Hours    Results for orders placed or performed during the hospital encounter of 11/11/14 (from the past 24 hour(s))  CBC Status: Abnormal   Collection Time: 11/15/14 7:10 AM  Result Value Ref Range   WBC 7.3 4.0 - 10.5 K/uL   RBC 3.78 (L) 3.87 - 5.11 MIL/uL   Hemoglobin 9.9 (L) 12.0 - 15.0 g/dL   HCT 30.7 (L) 36.0 - 46.0 %   MCV 81.2 78.0 - 100.0 fL   MCH 26.2 26.0 - 34.0 pg   MCHC 32.2 30.0 - 36.0 g/dL   RDW 15.5 11.5 - 15.5 %   Platelets 159 150 - 400 K/uL  Basic metabolic panel Status: Abnormal   Collection Time: 11/15/14 7:10 AM  Result Value Ref Range   Sodium 139 135 - 145 mmol/L   Potassium 3.5 3.5 - 5.1 mmol/L   Chloride 112 96 - 112 mmol/L   CO2 21  19 - 32 mmol/L   Glucose, Bld 103 (H) 70 - 99 mg/dL   BUN 9 6 - 23 mg/dL   Creatinine, Ser 0.95 0.50 - 1.10 mg/dL   Calcium 8.1 (L) 8.4 - 10.5 mg/dL   GFR calc non Af Amer 55  (L) >90 mL/min   GFR calc Af Amer 64 (L) >90 mL/min   Anion gap 6 5 - 15      Imaging Results (Last 48 hours)    No results found.    Assessment/Plan: Diagnosis: left thalamic to internal capsule ICH 1. Does the need for close, 24 hr/day medical supervision in concert with the patient's rehab needs make it unreasonable for this patient to be served in a less intensive setting? Yes 2. Co-Morbidities requiring supervision/potential complications: htn 3. Due to bladder management, bowel management, safety, skin/wound care, disease management, medication administration, pain management and patient education, does the patient require 24 hr/day rehab nursing? Yes 4. Does the patient require coordinated care of a physician, rehab nurse, PT (1-2 hrs/day, 5 days/week) and OT (1-2 hrs/day, 5 days/week) to address physical and functional deficits in the context of the above medical diagnosis(es)? Yes Addressing deficits in the following areas: balance, endurance, locomotion, strength, transferring, bowel/bladder control, bathing, dressing, feeding, grooming, toileting and psychosocial support 5. Can the patient actively participate in an intensive therapy program of at least 3 hrs of therapy per day at least 5 days per week? Yes 6. The potential for patient to make measurable gains while on inpatient rehab is excellent 7. Anticipated functional outcomes upon discharge from inpatient rehab are modified independent with PT, modified independent and supervision with OT, n/a with SLP. 8. Estimated rehab length of stay to reach the above functional goals is: 13-15 day 9. Does the patient have adequate social supports and living environment to accommodate these discharge functional goals? Yes 10. Anticipated D/C setting: Home 11. Anticipated post D/C treatments: HH therapy and Outpatient therapy 12. Overall Rehab/Functional Prognosis: excellent  RECOMMENDATIONS: This patient's condition is  appropriate for continued rehabilitative care in the following setting: CIR Patient has agreed to participate in recommended program. Yes Note that insurance prior authorization may be required for reimbursement for recommended care.  Comment: Rehab Admissions Coordinator to follow up.  Thanks,  Meredith Staggers, MD, Mellody Drown     11/15/2014       Revision History     Date/Time User Provider Type Action   11/15/2014 4:22 PM Meredith Staggers, MD Physician Sign   11/15/2014 2:39 PM Bary Leriche, PA-C Physician Assistant Share   View Details Report       Routing History

## 2014-11-17 NOTE — Evaluation (Signed)
Speech Language Pathology Assessment and Plan  Patient Details  Name: Brenda Blanchard MRN: [id number] Date of Birth: 1934-10-02  SLP Diagnosis: Aphasia;Cognitive Impairments  Rehab Potential: Good ELOS: 14 days     Today's Date: 11/17/2014 SLP Individual Time: 0258-5277 SLP Individual Time Calculation (min): 60 min   Problem List:  Patient Active Problem List   Diagnosis Date Noted  . Right hemiparesis 11/17/2014  . ICH (intracerebral hemorrhage) 11/16/2014  . Essential hypertension 11/16/2014  . Intracerebral hemorrhage of other cerebral location   . Stroke due to intracerebral hemorrhage 11/11/2014   Past Medical History:  Past Medical History  Diagnosis Date  . Hypertension   . Endometrial cancer   . Seasonal allergies    Past Surgical History:  Past Surgical History  Procedure Laterality Date  . Abdominal hysterectomy  2013  . Tonsillectomy and adenoidectomy      Assessment / Plan / Recommendation Clinical Impression   Brenda Blanchard is a 81 y.o. female with history of untreated HTN who was admitted on 11/11/14 with acute onset of right sided weakness, difficulty talking and right facial droop.  CT head done revealing 20 x 22 mm area of hyperdense hemorrhage in the superior left thalamus, tracking into the posterior left corona radiata with intraventricular extension likely due to hypertensive bleed.  Patient with resultant right hemiparesis with mild ataxia, right inattention with visual deficits as well as transcortical aphasia. Pt was admitted to CIR on 11/16/2014.  SLP evaluation completed on 11/17/2014 with the following results:  Pt presents with continued improvements in word finding deficits in comparison to previous ST notes.  No verbal errors or anomia was noted on structured evaluation measures or during functional conversations with the SLP; however, pt reports that she still has difficulty with word finding during conversations.  Pt's written expression was also grossly  intact for context and syntax, although legibility has been moderately reduced due to stroke affecting her dominant hand.  No difficulties were noted for basic to semi-complex auditory comprehension except pt benefited from intermittent repetition of questions and/or instructions which appeared to be related to diminished hearing.   Furthermore, pt presents with mild cognitive deficits for higher level tasks characterized by decreased storage and retrieval of new information and decreased self monitoring and correction of errors for functional problem solving.  Pt demonstrated grossly intact intellectual/emergent awareness of deficits occurring s/p hemorrhage.  Additionally, pt also exhibited intact sustained and selective attention.  Given that pt was independent prior to admission, she would benefit from ST follow up while inpatient in order to maximize functional independence and reduce burden of care prior to discharge.    Skilled Therapeutic Interventions          Cognitive-linguistic evaluation completed with results and recommendations reviewed with patient. Pt updated regarding anticipated goals to be targeted while inpatient.      SLP Assessment  Patient will need skilled [facility] during CIR admission    Recommendations  Patient destination: Home Follow up Recommendations:  (TBD pending progress made while inpatient ) Equipment Recommended: None recommended by SLP    SLP Frequency 3 to 5 out of 7 days   SLP Treatment/Interventions Cognitive remediation/compensation;Functional tasks;Cueing hierarchy;Internal/external aids;Multimodal communication approach;Speech/Language facilitation;Patient/family education    Pain Pain Assessment Pain Assessment: No/denies pain Prior Functioning Cognitive/Linguistic Baseline: Within functional limits Type of Home: House  Lives With: Alone Available Help at Discharge: Family;Available 24 hours/day Education: completed high  school, some college  Vocation: Retired  Industrial/product designer Term Goals:  Week 1: SLP Short Term Goal 1 (Week 1): Pt will improve semi-complex word finding to improve functional communication in conversations over 80% of observable opportunities wtih supervision  SLP Short Term Goal 2 (Week 1): Pt will improve semi-complex functional problem solving with supervision over 80% of observable opportunities.  SLP Short Term Goal 3 (Week 1): Pt will improve recall of daily information via compensatory aids for 80% accuracy with supervision.   See FIM for current functional status Refer to Care Plan for Long Term Goals  Recommendations for other services: None  Discharge Criteria: Patient will be discharged from SLP if patient refuses treatment 3 consecutive times without medical reason, if treatment goals not met, if there is a change in medical status, if patient makes no progress towards goals or if patient is discharged from hospital.  The above assessment, treatment plan, treatment alternatives and goals were discussed and mutually agreed upon: by patient  Page, Nicole L 11/17/2014, 11:11 AM   

## 2014-11-17 NOTE — Progress Notes (Signed)
Retta Diones, RN Rehab Admission Coordinator Signed Physical Medicine and Rehabilitation PMR Pre-admission 11/16/2014 11:42 AM  Related encounter: ED to Hosp-Admission (Discharged) from 11/11/2014 in [facility] Collapse All   PMR Admission Coordinator Pre-Admission Assessment  Patient: Brenda Blanchard is an 81 y.o., female MRN: [id number] DOB: 1934-03-26 Height: 5\' 5"  (165.1 cm) Weight: 86.183 kg (190 lb)  Insurance Information HMO: No PPO: PCP: IPA: 80/20: OTHER:  PRIMARY: Medicare A/B Policy#: [id number] D Subscriber: Rubie Maid CM Name: Phone#: Fax#:  Pre-Cert#: Employer: Not employed Benefits: Phone #: Name: Checked in [place]. Date: 10/08/00 Deduct: $1288 Out of Pocket Max: none Life Max: unlimited CIR: 100% SNF: 100 days Outpatient: 80% Co-Pay: 20% Home Health: 100% Co-Pay: none DME: 80% Co-Pay: 20% Providers: patient's choice  SECONDARY: Tricare Policy#: [id number] Subscriber: Rubie Maid CM Name: Phone#: Fax#:  Pre-Cert#: Employer: Not employed Benefits: Phone #: [phone] Name: Automated Eff. 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 female with history of untreated HTN who was admitted  on 11/11/14 with acute onset of right sided weakness, difficulty talking and right facial droop. SBP>200 at admission and patient treated with IV labetalol. CT head done revealing 20 x 22 mm area of hyperdense hemorrhage in the superior left thalamus, tracking into the posterior left corona radiata with intraventricular extension likely due to hypertensive bleed. Neurology recommended BP management with cardene drip and follow up CCT with stable bleed and no significant change in vasogenic edema. 2D echo with moderate LVH with EF 55-60% and trivial pericardial effusion. Patient with resultant right hemiparesis with mild ataxia, right inattention with visual deficits as well as transcortical aphasia. PT/OT evaluations done yesterday and CIR recommended by MD and rehab team.  Total: 8=NIH  Past Medical History  Past Medical History  Diagnosis Date  . Hypertension     Family History  family history is not on file.  Prior Rehab/Hospitalizations: No previous rehab admissions.  Current Medications   Current facility-administered medications:  . stroke: mapping our early stages of recovery book, , Does not apply, Once, Amie Portland, MD . acetaminophen (TYLENOL) tablet 650 mg, 650 mg, Oral, Q4H PRN **OR** acetaminophen (TYLENOL) suppository 650 mg, 650 mg, Rectal, Q4H PRN, Amie Portland, MD . atenolol (TENORMIN) tablet 100 mg, 100 mg, Oral, Daily, Candida Peeling, PA-C, 100 mg at 11/16/14 1041 . cloNIDine (CATAPRES) tablet 0.3 mg, 0.3 mg, Oral, BID, Candida Peeling, PA-C, 0.3 mg at 11/16/14 1042 . labetalol (NORMODYNE,TRANDATE) injection 10-40 mg, 10-40 mg, Intravenous, Q10 min PRN, Amie Portland, MD, 20 mg at 11/13/14 1326 . pantoprazole (PROTONIX) EC tablet 40 mg, 40 mg, Oral, QHS, Rosalin Hawking, MD, 40 mg at 11/15/14 2134 . senna-docusate (Senokot-S) tablet 1 tablet, 1 tablet, Oral, BID, Amie Portland, MD, 1 tablet at 11/15/14 2134  Patients Current  Diet: Diet Heart  Precautions / Restrictions Precautions Precautions: Fall Restrictions Weight Bearing Restrictions: No   Prior Activity Level Community (5-7x/wk): Went out 3-5 X a week   Development worker, international aid / [place] Devices/Equipment: None  Home Equipment: None  Prior Functional Level Prior Function Level of Independence: Independent  Current Functional Level Cognition  Arousal/Alertness: Awake/alert Overall Cognitive Status: Impaired/Different from baseline Current Attention Level: Selective Orientation Level: Oriented to person, Oriented to place, Oriented to situation Following Commands: Follows one step commands inconsistently General Comments: Pt. able to identify some of her deficits but not all especially her leaning to the left, used the mirror for visual cues for correction Attention: Sustained Sustained Attention: Appears intact Memory: (to assess further) Awareness: Appears intact Problem Solving: Impaired Problem Solving Impairment: Functional basic Safety/Judgment: (questionable)   Extremity Assessment (includes Sensation/Coordination)  Upper Extremity Assessment: RUE deficits/detail RUE Deficits / Details: Rt. UE grossly 2/5 - 3-/5. Pt with mild ataxia Rt. UE and demonstrates motor impersistance.  RUE Sensation: decreased proprioception RUE Coordination: decreased fine motor, decreased gross motor  Lower Extremity Assessment: Defer to PT evaluation RLE Deficits / Details: 3+/5 gross  RLE Sensation: decreased light touch, decreased proprioception RLE Coordination: decreased gross motor    ADLs  Overall ADL's : Needs assistance/impaired Eating/Feeding: Set up, Sitting Grooming: Wash/dry hands, Wash/dry face, Oral care, Brushing hair, Minimal assistance, Sitting Upper Body Bathing: Moderate assistance, Sitting Lower Body Bathing: Moderate assistance, Sit to/from stand Lower Body Bathing Details (indicate cue type and  reason): Requirs assist for peri area and for standing  Upper Body Dressing : Moderate assistance, Sitting Lower Body Dressing: Moderate assistance, Sit to/from stand Lower Body Dressing Details (indicate cue type and reason): assist for pulling pants over hips and for standing balance. Pt able to don/doff socks with min guard assist sitting in recliner  Toilet Transfer: Moderate assistance, Stand-pivot, BSC Toileting- Clothing Manipulation and Hygiene: Maximal assistance, Sit to/from stand Functional mobility during ADLs: Moderate assistance General ADL Comments: Pt requires assist for balance and Rt. UE weakness    Mobility  Overal bed mobility: +2 for physical assistance, Needs Assistance Bed Mobility: Supine to Sit Supine to sit: Mod assist, +2 for physical assistance, +2 for safety/equipment General bed mobility comments: Pt sitting up in recliner upon PT arrival.     Transfers  Overall transfer level: Needs assistance Equipment used: Rolling walker (2 wheeled) Transfers: Sit to/from Stand Sit to Stand: Mod assist Stand pivot transfers: Mod assist General transfer comment: Mod A to stand from chair x3 with manual cues for hand/foot placement and for anterior translation. Pt attempting to push up with left forearm. Manual cues to place hands on walker handles and on bed rail during pre gait. Pt pushes to left during standing with trunk rotation.    Ambulation / Gait / Stairs / Wheelchair Mobility  Ambulation/Gait Ambulation/Gait assistance: Mod assist Ambulation Distance (Feet): 20 Feet Assistive device: Rolling walker (2 wheeled) Gait Pattern/deviations: Step-to pattern, Step-through pattern, Decreased step length - right, Decreased stride length, Narrow base of support, Trunk flexed (left trunk rotation) Gait velocity interpretation: Below normal speed for age/gender General Gait Details: Cues to increase step length on RLE to facilitate step through gait. Increased  hip/knee flexion throughout gait requiring constant verbal/manual cues for upright posture and hip extension. Pt with left trunk rotation. Fatigues.    Posture / Balance Dynamic Sitting Balance Sitting balance - Comments: Pt with lean towards left however able to self correct to midline with verbal/mirroring cues. Impaired trunk control with LOB posteriorly when sitting without back support. Balance Overall balance assessment: Needs assistance Sitting-balance support: Feet supported, No upper extremity supported Sitting balance-Leahy Scale: Fair Sitting balance - Comments: Pt with lean towards left however  able to self correct to midline with verbal/mirroring cues. Impaired trunk control with LOB posteriorly when sitting without back support. Postural control: Left lateral lean Standing balance support: Bilateral upper extremity supported Standing balance-Leahy Scale: Poor Standing balance comment: difficulty with shifting weight and lifting up feet in marching inside the walker, improved standing at bedside with bed rails    Special needs/care consideration BiPAP/CPAP No CPM No Continuous Drip IV No Dialysis No  Life Vest No Oxygen No Special Bed No Trach Size No Wound Vac (area) No  Skin No  Bowel mgmt: Last BM 11/16/14 Bladder mgmt: Voiding on BSC and in bathroom with assistance Diabetic mgmt No    Previous Home Environment Living Arrangements: Alone Type of Home: House Home Layout: One level Home Access: Stairs to enter Entrance Stairs-Rails: Right Entrance Stairs-Number of Steps: 2 [place]: No Additional Comments: Pt was independent and driving prior to admission  Discharge Living Setting Plans for Discharge Living Setting: Patient's home, Alone, House (Lives alone.) Type of Home at Discharge: House Discharge Home Layout: One level Discharge Home Access: Stairs to enter Entrance Stairs-Number of Steps: 2  steps Does the patient have any problems obtaining your medications?: No  Social/Family/Support Systems Patient Roles: Parent (Has 2 sons who live in [place].) Contact Information: Eiza Canniff - son [phone] Anticipated Caregiver: sons may come stay with patient after rehab and alternate caregiver roles Ability/Limitations of Caregiver: [name] and Remo Lipps live in [place]. Billy plans to come stay with patient for 1-2 weeks after rehab. He will contact his brother Remo Lipps to try to get his help as well. Bayard Beaver, sister, lives locally but cannot be a caregiver. Caregiver Availability: Other (Comment) (Son planning to come for 1-2 weeks to stay.) Discharge Plan Discussed with Primary Caregiver: Yes Is Caregiver In Agreement with Plan?: Yes Does Caregiver/Family have Issues with Lodging/Transportation while Pt is in Rehab?: No  Goals/Additional Needs Patient/Family Goal for Rehab: PT mod I, OT Mod I to supervision and ST mod I to supervision goals Expected length of stay: 13-15 days Cultural Considerations: None Dietary Needs: Heart diet Equipment Needs: TBD Pt/Family Agrees to Admission and willing to participate: Yes Program Orientation Provided & Reviewed with Pt/Caregiver Including Roles & Responsibilities: Yes  Decrease burden of Care through IP rehab admission: N/A  Possible need for SNF placement upon discharge: Not planned  Patient Condition: This patient's condition remains as documented in the consult dated 11/15/14, in which the Rehabilitation Physician determined and documented that the patient's condition is appropriate for intensive rehabilitative care in an inpatient rehabilitation facility. Will admit to inpatient rehab today.  Preadmission Screen Completed By: Retta Diones, 11/16/2014 11:58 AM ______________________________________________________________________  Discussed status with Dr. Naaman Plummer on 11/16/14 at 1157 and received telephone approval for  admission today.  Admission Coordinator: Retta Diones, time1157/Date02/09/16          Cosigned by: Meredith Staggers, MD at 11/16/2014 3:22 PM  Revision History     Date/Time User Provider Type Action   11/16/2014 3:22 PM Meredith Staggers, MD Physician Cosign   11/16/2014 1:41 PM Retta Diones, RN Rehab Admission Coordinator Sign
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
## 2014-11-17 NOTE — Evaluation (Signed)
Physical Therapy Assessment and Plan  Patient Details  Name: Brenda Blanchard MRN: [id number] Date of Birth: 1934-08-28  PT Diagnosis: Abnormal posture, Abnormality of gait, Hemiplegia dominant and Impaired sensation and impaired proprioception Rehab Potential: Good ELOS: 15-18 days   Today's Date: 11/17/2014 PT Individual Time: 0830-0930 PT Individual Time Calculation (min): 60 min    Problem List:  Patient Active Problem List   Diagnosis Date Noted  . Right hemiparesis 11/17/2014  . ICH (intracerebral hemorrhage) 11/16/2014  . Essential hypertension 11/16/2014  . Intracerebral hemorrhage of other cerebral location   . Stroke due to intracerebral hemorrhage 11/11/2014   Past Medical History:  Past Medical History  Diagnosis Date  . Hypertension   . Endometrial cancer   . Seasonal allergies    Past Surgical History:  Past Surgical History  Procedure Laterality Date  . Abdominal hysterectomy  2013  . Tonsillectomy and adenoidectomy     Assessment & Plan Clinical Impression: Brenda Blanchard is a 81 y.o. female with history of untreated HTN who was admitted on 11/11/14 with acute onset of right sided weakness, difficulty talking and right facial droop. SBP>200 at admission and patient treated with IV labetalol. CT head done revealing 20 x 22 mm area of hyperdense hemorrhage in the superior left thalamus, tracking into the posterior left corona radiata with intraventricular extension likely due to hypertensive bleed. Neurology recommended BP management with cardene drip and follow up CCT with stable bleed and no significant change in vasogenic edema. 2D echo with moderate LVH with EF 55-60% and trivial pericardial effusion. Patient with resultant right hemiparesis with mild ataxia, right inattention with visual deficits as well as transcortical aphasia.  Patient transferred to CIR on 11/16/2014 .   Patient currently requires total assist with mobility secondary to impaired timing and  sequencing, unbalanced muscle activation and decreased coordination, decreased attention to right and decreased standing balance, decreased postural control, hemiplegia and decreased balance strategies.  Prior to hospitalization, patient was independent  with mobility and lived with Alone in a House home.  Home access is 2Stairs to enter.  Patient will benefit from skilled PT intervention to maximize safe functional mobility and minimize fall risk for planned discharge home with intermittent assist.  Anticipate patient will benefit from follow up OP at discharge.  PT - End of Session Activity Tolerance: Tolerates 30+ min activity with multiple rests Endurance Deficit: Yes Endurance Deficit Description: Pt requested seated rest break after ambulating x10'. PT Assessment Rehab Potential (ACUTE/IP ONLY): Good Barriers to Discharge: Decreased caregiver support;Inaccessible home environment (2 steps to enter home without rails.) PT Patient demonstrates impairments in the following area(s): Balance;Endurance;Motor;Perception;Safety;Sensory PT Transfers Functional Problem(s): Bed Mobility;Bed to Chair;Car;Furniture;Floor PT Locomotion Functional Problem(s): Wheelchair Mobility;Stairs;Ambulation PT Plan PT Intensity: Minimum of 1-2 x/day ,45 to 90 minutes PT Frequency: 5 out of 7 days PT Duration Estimated Length of Stay: 15-18 days PT Treatment/Interventions: Ambulation/gait training;Balance/vestibular training;Cognitive remediation/compensation;Disease management/prevention;Discharge planning;DME/adaptive equipment instruction;Functional mobility training;Functional electrical stimulation;Patient/family education;Neuromuscular re-education;Splinting/orthotics;Therapeutic Activities;Therapeutic Exercise;Visual/perceptual remediation/compensation;Stair training;UE/LE Strength taining/ROM;Wheelchair propulsion/positioning PT Transfers Anticipated Outcome(s): Mod I PT Locomotion Anticipated Outcome(s):  Ambulatory at supervision level PT Recommendation Recommendations for Other Services: Other (comment) (recreational therapy) Follow Up Recommendations: Outpatient PT;Other (comment) (intermittent assist) Patient destination: Home Equipment Recommended: To be determined Equipment Details: Pt owns no personal equipment.  Skilled Therapeutic Intervention PT evaluation performed. See below for detailed findings. Treatment initiated. Session focused on gait training and stair negotiation. See below for detailed description of assist/cueing required with bed mobility, w/c mobility, gait and  stairs. Educated pt on findings, goals, and plan of care. Oriented pt to rehab unit, fall precautions. Pt verbalized understanding of all education and was in full agreement with plan of care. Departed with pt seated in w/c with all needs within reach.  PT Evaluation Precautions/Restrictions Precautions Precautions: Fall Restrictions Weight Bearing Restrictions: No Vitals Therapy Vitals Temp: 98.1 F (36.7 C) Temp Source: Oral Pulse Rate: (!) 53 Resp: 20 BP: (!) 141/50 mmHg Patient Position (if appropriate): Sitting Oxygen Therapy SpO2: 98 % O2 Device: Not DeliveredPain Pain Assessment Pain Assessment: No/denies pain Home Living/Prior Functioning Home Living Available Help at Discharge: Family (son Brenda Blanchard plans to stay for 1-2 weeks at discharge) Type of Home: House Home Access: Stairs to enter Technical brewer of Steps: 2 Entrance Stairs-Rails: None Home Layout: One level Additional Comments: Pt was independent and driving prior to admission  Lives With: Alone Prior Function Level of Independence: Independent with basic ADLs;Independent with gait;Independent with transfers;Independent with homemaking with ambulation  Able to Take Stairs?: Yes Driving: Yes 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: Hobbies-yes (Comment) Comments: shopping, cooking, and  doing projects in home Cognition Overall Cognitive Status: Impaired/Different from baseline Arousal/Alertness: Awake/alert Orientation Level: Oriented X4 Attention: Selective Sustained Attention: Appears intact Memory: Impaired Memory Impairment: Storage deficit;Retrieval deficit;Decreased recall of new information Awareness: Appears intact Problem Solving: Impaired Problem Solving Impairment: Functional basic Executive Function: Self Correcting;Self Monitoring;Organizing Organizing: Appears intact Self Monitoring: Impaired Self Monitoring Impairment: Functional basic Self Correcting: Impaired Self Correcting Impairment: Functional basic Safety/Judgment: Appears intact Sensation Sensation Light Touch: Impaired Detail Light Touch Impaired Details: Absent RLE;Impaired RUE Stereognosis: Not tested Hot/Cold: Not tested Proprioception: Impaired Detail Proprioception Impaired Details: Absent RLE;Absent RUE Additional Comments: Pt reports no sensation in RLE and diminished sensation in RUE. Pt unable to localize light touch in RUE. Coordination Gross Motor Movements are Fluid and Coordinated: No Fine Motor Movements are Fluid and Coordinated: No Heel Shin Test: RLE limited by weakness. Motor    Decreased postural control; R hemiplegia Mobility Bed Mobility Bed Mobility: Supine to Sit Supine to Sit: 3: Mod assist Supine to Sit Details: Verbal cues for technique;Verbal cues for sequencing Sit to Supine: 3: Mod assist Sit to Supine - Details: Verbal cues for sequencing;Verbal cues for technique Transfers Transfers: Yes Sit to Stand: 3: Mod assist;From chair/3-in-1 Sit to Stand Details: Tactile cues for weight beaing;Tactile cues for weight shifting;Verbal cues for technique Stand to Sit: 3: Mod assist Stand to Sit Details (indicate cue type and reason): Tactile cues for weight beaing;Tactile cues for weight shifting;Verbal cues for precautions/safety Squat Pivot Transfers: 3: Mod  assist Squat Pivot Transfer Details: Tactile cues for weight shifting;Verbal cues for sequencing;Verbal cues for technique Locomotion  Ambulation Ambulation: Yes Ambulation/Gait Assistance: 1: +2 Total assist;2: Max assist (+2A for w/c follow) Ambulation Distance (Feet): 10 Feet Assistive device: Other (Comment) (L rail) Ambulation/Gait Assistance Details: Tactile cues for sequencing;Verbal cues for gait pattern;Tactile cues for posture;Manual facilitation for weight shifting Gait Gait: Yes Gait Pattern: Impaired Gait Pattern: Step-to pattern;Decreased step length - right;Decreased step length - left;Decreased weight shift to right;Decreased hip/knee flexion - right;Decreased dorsiflexion - right;Decreased stance time - right;Poor foot clearance - right;Trunk flexed;Decreased trunk rotation Stairs / Additional Locomotion Stairs: Yes Stairs Assistance: 1: +2 Total assist Stairs Assistance Details: Manual facilitation for weight shifting;Verbal cues for technique;Tactile cues for sequencing;Verbal cues for sequencing;Tactile cues for posture;Manual facilitation for placement Stairs Assistance Details (indicate cue type and reason): Pt negotiated 3 stairs with step-to pattern, ascending  forward with bilat rails and descending with single rail. Pt required total A for stabilty, +2A for safety. Stair Management Technique: Step to pattern;Backwards;Forwards;Two rails;One rail Left Number of Stairs: 3 Height of Stairs: 4.5 Wheelchair Mobility Wheelchair Mobility: Yes Wheelchair Assistance: 2: Max Technical sales engineer Details: Verbal cues for sequencing;Verbal cues for technique;Manual facilitation for placement;Verbal cues for safe use of DME/AE Wheelchair Propulsion: Left upper extremity;Left lower extremity Wheelchair Parts Management: Needs assistance Distance: 15  Trunk/Postural Assessment  Cervical Assessment Cervical Assessment: Within Functional Limits (limited cervical spine  extension during standing/ambulation) Thoracic Assessment Thoracic Assessment: Within Functional Limits (limited thoracic spine extension during standing/ambulation) Lumbar Assessment Lumbar Assessment: Within Functional Limits Postural Control Postural Control: Deficits on evaluation Righting Reactions: Delayed/ineffective Postural Limitations: Forward flexed posture in standing; pt with difficulty maintaining upright posture even with cueing  Balance Balance Balance Assessed: Yes Dynamic Sitting Balance Dynamic Sitting - Balance Support: Feet supported;No upper extremity supported Dynamic Sitting - Level of Assistance: 4: Min assist;5: Stand by assistance Dynamic Standing Balance Dynamic Standing - Balance Support: Left upper extremity supported Dynamic Standing - Level of Assistance: 2: Max assist Extremity Assessment      RLE Assessment RLE Assessment: Exceptions to [facility] RLE Strength RLE Overall Strength: Deficits RLE Overall Strength Comments: Grossly 3-/5 in R hip/knee and ankle dorsiflexion; 4-/5 ankle plantarflexion LLE Assessment LLE Assessment: Within Functional Limits  FIM:  FIM - Locomotion: Wheelchair Distance: 15 FIM - Locomotion: Ambulation Ambulation/Gait Assistance: 1: +2 Total assist;2: Max assist (+2A for w/c follow)   Refer to Care Plan for Long Term Goals  Recommendations for other services: Other: Recreational therapy  Discharge Criteria: Patient will be discharged from PT if patient refuses treatment 3 consecutive times without medical reason, if treatment goals not met, if there is a change in medical status, if patient makes no progress towards goals or if patient is discharged from hospital.  The above assessment, treatment plan, treatment alternatives and goals were discussed and mutually agreed upon: by patient  Stefano Gaul 11/17/2014, 8:52 PM

## 2014-11-17 NOTE — Progress Notes (Signed)
Notified Marlowe Shores, PA of pts morning blood pressure. Pt gets a beta blocker at 0800. Pt is asymptomatic. We will give morning medications as scheduled and continue to monitor. Kennieth Francois, RN

## 2014-11-17 NOTE — Progress Notes (Signed)
Patient information reviewed and entered into eRehab system by Shannel Zahm, RN, CRRN, PPS Coordinator.  Information including medical coding and functional independence measure will be reviewed and updated through discharge.     Per nursing patient was given "Data Collection Information Summary for Patients in Inpatient Rehabilitation Facilities with attached "Privacy Act Statement-Health Care Records" upon admission.  

## 2014-11-17 NOTE — Evaluation (Signed)
Occupational Therapy Assessment and Plan  Patient Details  Name: Brenda Blanchard MRN: [id number] Date of Birth: [birth year]  OT Diagnosis: ataxia, disturbance of vision, hemiplegia affecting dominant side and muscle weakness (generalized) Rehab Potential: Rehab Potential (ACUTE ONLY): Good ELOS: 16-20 days   Today's Date: 11/17/2014 OT Individual Time: 7017-7939 OT Individual Time Calculation (min): 60 min     Problem List:  Patient Active Problem List   Diagnosis Date Noted  . Right hemiparesis 11/17/2014  . ICH (intracerebral hemorrhage) 11/16/2014  . Essential hypertension 11/16/2014  . Intracerebral hemorrhage of other cerebral location   . Stroke due to intracerebral hemorrhage 11/11/2014    Past Medical History:  Past Medical History  Diagnosis Date  . Hypertension   . Endometrial cancer   . Seasonal allergies    Past Surgical History:  Past Surgical History  Procedure Laterality Date  . Abdominal hysterectomy  2013  . Tonsillectomy and adenoidectomy      Assessment & Plan Clinical Impression: Patient is a 81 y.o. year old female with history of untreated HTN who was admitted on 11/11/14 with acute onset of right sided weakness, difficulty talking and right facial droop. SBP>200 at admission and patient treated with IV labetalol. CT head done revealing 20 x 22 mm area of hyperdense hemorrhage in the superior left thalamus, tracking into the posterior left corona radiata with intraventricular extension likely due to hypertensive bleed. Neurology recommended BP management with cardene drip and follow up CCT with stable bleed and no significant change in vasogenic edema. 2D echo with moderate LVH with EF 55-60% and trivial pericardial effusion. Patient with resultant right hemiparesis with mild ataxia, right inattention with visual deficits as well as transcortical aphasia. Has had urinary retention since foley d/c yesterday.   Patient transferred to CIR on 11/16/2014 .    Patient  currently requires mod with basic self-care skills secondary to muscle weakness, unbalanced muscle activation, ataxia and decreased coordination, decreased visual motor skills, decreased attention to right and decreased standing balance, decreased postural control and hemiplegia.  Prior to hospitalization, patient could complete ADLs with independent .  Patient will benefit from skilled intervention to increase independence with basic self-care skills and increase level of independence with iADL prior to discharge home independently with assist from son initially on d/c and then intermittent assist from friends and family.  Anticipate patient will require intermittent supervision and follow up outpatient.  OT - End of Session Activity Tolerance: Tolerates 30+ min activity without fatigue Endurance Deficit: No OT Assessment Rehab Potential (ACUTE ONLY): Good Barriers to Discharge: Decreased caregiver support Barriers to Discharge Comments: Son Brenda Blanchard plans to stay 1-2 weeks at d/c OT Patient demonstrates impairments in the following area(s): Balance;Motor;Pain;Perception;Safety;Sensory;Vision OT Basic ADL's Functional Problem(s): Eating;Grooming;Bathing;Dressing;Toileting OT Advanced ADL's Functional Problem(s): Simple Meal Preparation;Laundry OT Transfers Functional Problem(s): Toilet;Tub/Shower OT Additional Impairment(s): Fuctional Use of Upper Extremity OT Plan OT Intensity: Minimum of 1-2 x/day, 45 to 90 minutes OT Frequency: 5 out of 7 days OT Duration/Estimated Length of Stay: 16-20 days OT Treatment/Interventions: Medical illustrator training;Community reintegration;Discharge planning;Disease mangement/prevention;DME/adaptive equipment instruction;Functional mobility training;Neuromuscular re-education;Pain management;Patient/family education;Psychosocial support;Self Care/advanced ADL retraining;Therapeutic Activities;Therapeutic Exercise;UE/LE Strength taining/ROM;UE/LE Coordination  activities;Visual/perceptual remediation/compensation OT Self Feeding Anticipated Outcome(s): Mod I OT Basic Self-Care Anticipated Outcome(s): Mod I OT Toileting Anticipated Outcome(s): Mod I OT Bathroom Transfers Anticipated Outcome(s): Mod I OT Recommendation Patient destination: Home Follow Up Recommendations: Home health OT;Outpatient OT (HH vs OP TBD) Equipment Recommended: Tub/shower bench   Skilled Therapeutic Intervention OT eval completed with education  regarding rehab plan, OT purpose, and potential OT goals and ELOS.  ADL assessment completed at sit > stand level at sink with bathing, pt with no clothes on eval therefore donned hospital gown with setup assist.  Mod assist squat pivot transfer bed > w/c with mod multimodal cues for technique and safety. Pt required mod assist with sit > stand and blocking at Rt knee to promote upright standing.  Stand pivot transfers w/c <> toilet with use of grab bars for forward weight shift.  Pt demonstrated mild ataxia and discoordinated movements with RUE when applying deodorant and brushing teeth.  Pt with decreased functional grasp when opening toothpaste and dropping soap bottle and toothbrush.    OT Evaluation Precautions/Restrictions  Precautions Precautions: Fall Restrictions Weight Bearing Restrictions: No General   Vital Signs Therapy Vitals Pulse Rate: (!) 53 Resp: 20 BP: (!) 141/50 mmHg Patient Position (if appropriate): Sitting Oxygen Therapy SpO2: 98 % O2 Device: Not Delivered Pain Pain Assessment Pain Assessment: No/denies pain Home Living/Prior Functioning Home Living Family/patient expects to be discharged to:: Private residence Living Arrangements: Alone Available Help at Discharge: Family, Available 24 hours/day Type of Home: House Home Access: Stairs to enter Technical brewer of Steps: 2 Entrance Stairs-Rails: None Home Layout: One level Additional Comments: Pt was independent and driving prior to  admission  Lives With: Alone IADL History Homemaking Responsibilities: Yes Meal Prep Responsibility: Primary Laundry Responsibility: Primary Cleaning Responsibility: Primary Bill Paying/Finance Responsibility: Primary Shopping Responsibility: Primary Current License: Yes Education: completed high school, some college  Prior Function Level of Independence: Independent with basic ADLs, Independent with gait, Independent with transfers, Independent with homemaking with ambulation  Able to Take Stairs?: Yes Driving: Yes 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: Hobbies-yes (Comment) Comments: shopping, cooking, and doing projects in home ADL  See FIM Vision/Perception  Vision- History Baseline Vision/History: No visual deficits Wears Glasses: Reading only Patient Visual Report: No change from baseline Vision- Assessment Vision Assessment?: Yes Eye Alignment: Within Functional Limits Ocular Range of Motion: Within Functional Limits Tracking/Visual Pursuits: Requires cues, head turns, or add eye shifts to track Visual Fields: No apparent deficits  Cognition Overall Cognitive Status: Impaired/Different from baseline Arousal/Alertness: Awake/alert Orientation Level: Oriented X4 Attention: Selective Sustained Attention: Appears intact Memory: Impaired Memory Impairment: Storage deficit;Retrieval deficit;Decreased recall of new information Awareness: Appears intact Problem Solving: Impaired Problem Solving Impairment: Functional basic Executive Function: Self Correcting;Self Monitoring;Organizing Organizing: Appears intact Self Monitoring: Impaired Self Monitoring Impairment: Functional basic Self Correcting: Impaired Self Correcting Impairment: Functional basic Safety/Judgment: Appears intact Sensation Sensation Light Touch: Impaired Detail Light Touch Impaired Details: Absent RLE;Impaired RUE Stereognosis: Not tested Hot/Cold: Not  tested Proprioception: Impaired Detail Proprioception Impaired Details: Absent RLE;Absent RUE Additional Comments: Pt reports no sensation in RLE and diminished sensation in RUE. Pt unable to localize light touch in RUE. Coordination Gross Motor Movements are Fluid and Coordinated: No Fine Motor Movements are Fluid and Coordinated: No Finger Nose Finger Test: Rt: 3x in 10 seconds with mild discoordination, Lt: 8x in 10 seconds Heel Shin Test: RLE limited by weakness. Motor  Motor Motor: Hemiplegia;Abnormal postural alignment and control Motor - Skilled Clinical Observations: R hemiplegia; forward flexed posture in standing Mobility     Trunk/Postural Assessment     Balance   Extremity/Trunk Assessment RUE Assessment RUE Assessment: Exceptions to [facility] RUE AROM (degrees) RUE Overall AROM Comments: shoulder approx 100 degrees flexion, elbow WFL, wrist WFL, finger flexion/extension WFL RUE Strength RUE Overall Strength Comments: grossly 3-/5 shoulder and  ticeps, 3+/5 biceps, loose gross grasp (dropped multiple items during self-care task) LUE Assessment LUE Assessment: Within Functional Limits  FIM:  FIM - Grooming Grooming Steps: Wash, rinse, dry face;Wash, rinse, dry hands;Oral care, brush teeth, clean dentures;Brush, comb hair Grooming: 5: Set-up assist to obtain items FIM - Bathing Bathing Steps Patient Completed: Chest;Right Arm;Left Arm;Abdomen;Buttocks;Right upper leg;Left upper leg Bathing: 3: Mod-Patient completes 5-7 [id number] parts or 50-74% FIM - Upper Body Dressing/Undressing Upper body dressing/undressing: 0: Wears gown/pajamas-no public clothing FIM - Lower Body Dressing/Undressing Lower body dressing/undressing: 0: Wears gown/pajamas-no public clothing FIM - Toileting Toileting steps completed by patient: Adjust clothing prior to toileting;Performs perineal hygiene;Adjust clothing after toileting Toileting Assistive Devices: Grab bar or rail for support Toileting: 4:  Steadying assist FIM - BControl and instrumentation engineerDevices: Bed rails Bed/Chair Transfer: 3: Bed > Chair or W/C: Mod A (lift or lower assist);3: Chair or W/C > Bed: Mod A (lift or lower assist) FIM - TRadio producerDevices: Elevated toilet seat;Grab bars Toilet Transfers: 3-To toilet/BSC: Mod A (lift or lower assist);3-From toilet/BSC: Mod A (lift or lower assist) FIM - Tub/Shower Transfers Tub/shower Transfers: 0-Activity did not occur or was simulated   Refer to Care Plan for Long Term Goals  Recommendations for other services: None  Discharge Criteria: Patient will be discharged from OT if patient refuses treatment 3 consecutive times without medical reason, if treatment goals not met, if there is a change in medical status, if patient makes no progress towards goals or if patient is discharged from hospital.  The above assessment, treatment plan, treatment alternatives and goals were discussed and mutually agreed upon: by patient  HSimonne Come2/07/2015, 12:20 PM

## 2014-11-17 NOTE — Care Management Note (Signed)
[place] Individual Statement of Services  Patient Name:  Brenda Blanchard  Date:  11/17/2014  Welcome to the [facility].  Our goal is to provide you with an individualized program based on your diagnosis and situation, designed to meet your specific needs.  With this comprehensive rehabilitation program, you will be expected to participate in at least 3 hours of rehabilitation therapies Monday-Friday, with modified therapy programming on the weekends.  Your rehabilitation program will include the following services:  Physical Therapy (PT), Occupational Therapy (OT), Speech Therapy (ST), 24 hour per day rehabilitation nursing, Case Management (Social Worker), Rehabilitation Medicine, Nutrition Services and Pharmacy Services  Weekly team conferences will be held on Wednesday to discuss your progress.  Your Social Worker will talk with you frequently to get your input and to update you on team discussions.  Team conferences with you and your family in attendance may also be held.  Expected length of stay: 16-18 days  Overall anticipated outcome: supervision/mod/i level  Depending on your progress and recovery, your program may change. Your Social Worker will coordinate services and will keep you informed of any changes. Your Social Worker's name and contact numbers are listed  below.  The following services may also be recommended but are not provided by the [facility] will be made to provide these services after discharge if needed.  Arrangements include referral to agencies that provide these services.  Your insurance has been verified to be: Medicare & Tricare  Your primary doctor is:  None  Pertinent information will be shared with your doctor and your insurance company.  Social Worker:  Ovidio Kin, [place] or [phone]  Information discussed with and copy given to patient by: [name], 11/17/2014, 3:31 PM

## 2014-11-18 ENCOUNTER — Inpatient Hospital Stay (HOSPITAL_COMMUNITY): Payer: Medicare Other | Admitting: Physical Therapy

## 2014-11-18 ENCOUNTER — Inpatient Hospital Stay (HOSPITAL_COMMUNITY): Payer: Medicare Other | Admitting: Speech Pathology

## 2014-11-18 ENCOUNTER — Encounter (HOSPITAL_COMMUNITY): Payer: Medicare Other | Admitting: Occupational Therapy

## 2014-11-18 DIAGNOSIS — G890 Central pain syndrome: Secondary | ICD-10-CM

## 2014-11-18 MED ORDER — CLONIDINE HCL 0.1 MG PO TABS
0.1000 mg | ORAL_TABLET | Freq: Four times a day (QID) | ORAL | Status: DC | PRN
  Administered 2014-11-18 – 2014-11-26 (×8): 0.1 mg via ORAL
  Filled 2014-11-18 (×12): qty 1

## 2014-11-18 NOTE — Progress Notes (Signed)
Social Work Brenda Blanchard, [place] Social Worker Signed  Patient Care Conference 11/17/2014  3:28 PM    Expand All Collapse All   Inpatient RehabilitationTeam Conference and Plan of Care Update Date: 11/17/2014   Time: 11;20 AM     Patient Name: Brenda Blanchard       Medical Record Number: [id number]  Date of Birth: 1934-09-25 Sex: Female         Room/Bed: 4M02C/4M02C-01 Payor Info: Payor: MEDICARE / Plan: MEDICARE PART A AND B / Product Type: *No Product type* /    Admitting Diagnosis: L ICH   Admit Date/Time:  11/16/2014  5:42 PM Admission Comments: No comment available   Primary Diagnosis:  ICH (intracerebral hemorrhage) Principal Problem: ICH (intracerebral hemorrhage)    Patient Active Problem List     Diagnosis  Date Noted   .  Right hemiparesis  11/17/2014   .  ICH (intracerebral hemorrhage)  11/16/2014   .  Essential hypertension  11/16/2014   .  Intracerebral hemorrhage of other cerebral location     .  Stroke due to intracerebral hemorrhage  11/11/2014     Expected Discharge Date: Expected Discharge Date: 12/04/14  Team Members Present: Physician leading conference: Dr. Alysia Penna Social Worker Present: Ovidio Kin, LCSW Nurse Present: Elliot Cousin, RN PT Present: Raylene Everts, PT;Caroline Lacinda Axon, PT;Blair Hobble, PT OT Present: [name], OT SLP Present: Windell Moulding, SLP PPS Coordinator present : Daiva Nakayama, RN, CRRN        Current Status/Progress  Goal  Weekly Team Focus   Medical     mild aphasia, mild R HP  Home D/C  initiate therapy program   Bowel/Bladder     Patient retaining urine and requiring in and out caths; continent of bowel; LBM 2/7  Continent of bowel and bladder  Assist patient to [facility] or toilet for all toileting to encourage bladder emptying   Swallow/Nutrition/ Hydration       na         ADL's     mod assist stand pivot transfers, unable to assess dressing due to no clothes on eval, mod assist bathing  mod I self-care tasks, supervision  higher level IADLs   transfers, sit <> stand, standing balance, RUE NMR, home making tasks   Mobility     Mod A bed mobility and transfers, +2A gait and stairs  Mod I for transfers and w/c mobility/management; supervision ambulation; min A stairs  R NMR, bed mobility, gait/transfer training, stair negotiation, initiate pt/family education   Communication     mild higher level word finding deficits in conversation   Mod I  education and carryover of compensatory strategies.    Safety/Cognition/ Behavioral Observations    mild higher level deficits for memory and functional problem solving   supervision   education and carryover of compensatory strategies    Pain     Denies  </=3  Assess pain qshift and PRN   Skin     No skin isssues noted  No new skin breakdown  Assess skin qshift and PRN; turn q2hr in bed      *See Care Plan and progress notes for long and short-term goals.    Barriers to Discharge:  has a home in [place]      Possible Resolutions to Barriers:   Will set up      Discharge Planning/Teaching Needs:     Home with son who will be here for 1-2 weeks, otherwise sister can  check in on her.  Needs to be mod/i level by discharge      Team Discussion:    New evaluations-goals mod/i wheelchair level, supervision ambulation. Retaining fluid-I & O cath will see if starts to void.  Sensation and perception affected by stroke.    Revisions to Treatment Plan:    New eval    Continued Need for Acute Rehabilitation Level of Care: The patient requires daily medical management by a physician with specialized training in physical medicine and rehabilitation for the following conditions: Daily direction of a multidisciplinary physical rehabilitation program to ensure safe treatment while eliciting the highest outcome that is of practical value to the patient.: Yes Daily medical management of patient stability for increased activity during participation in an intensive  rehabilitation regime.: Yes Daily analysis of laboratory values and/or radiology reports with any subsequent need for medication adjustment of medical intervention for : Neurological problems  Brenda Blanchard 11/18/2014, 8:33 AM                  Patient ID: Brenda Blanchard, female   DOB: 1934-06-29, 81 y.o.   MRN: [id number]

## 2014-11-18 NOTE — IPOC Note (Addendum)
Overall Plan of Care [facility]) Patient Details Name: Liridona Mashaw MRN: [id number] DOB: 05/16/1934  Admitting Diagnosis: L ICH  Hospital Problems: Principal Problem:   ICH (intracerebral hemorrhage) Active Problems:   Essential hypertension   Right hemiparesis     Functional Problem List: Nursing Bladder, Bowel, Endurance, Medication Management, Motor, Perception, Pain, Safety  PT Balance, Endurance, Motor, Perception, Safety, Sensory  OT Balance, Motor, Pain, Perception, Safety, Sensory, Vision  SLP Cognition, Linguistic  TR Activity tolerance, functional mobility, balance, cognition, safety, anxiety/stress        Basic ADL's: OT Eating, Grooming, Bathing, Dressing, Toileting     Advanced  ADL's: OT Simple Meal Preparation, Laundry     Transfers: PT Bed Mobility, Bed to Chair, Car, [employer], Futures trader, Metallurgist: PT Emergency planning/management officer, Stairs, Ambulation     Additional Impairments: OT Fuctional Use of Upper Extremity  SLP Communication, Social Cognition expression Problem Solving, Memory  TR      Anticipated Outcomes Item Anticipated Outcome  Self Feeding Mod I  Swallowing      Basic self-care  Mod I  Toileting  Mod I   Bathroom Transfers Mod I  Bowel/Bladder  manage bowel/bladder with min assistance  Transfers  Mod I  Locomotion  Ambulatory at supervision level  Communication  Mod I   Cognition  supervision   Pain  Manage patients pain at or below 3  Safety/Judgment  manage safety with min assistance   Therapy Plan: PT Intensity: Minimum of 1-2 x/day ,45 to 90 minutes PT Frequency: 5 out of 7 days PT Duration Estimated Length of Stay: 15-18 days OT Intensity: Minimum of 1-2 x/day, 45 to 90 minutes OT Frequency: 5 out of 7 days OT Duration/Estimated Length of Stay: 16-20 days SLP Intensity: Minumum of 1-2 x/day, 30 to 90 minutes SLP Frequency: 3 to 5 out of 7 days SLP Duration/Estimated Length of Stay: 14 days   TR  Duration/ELOS:  2 weeks TR Frequency:  Min 1 time per week >20 minutes       Team Interventions: Nursing Interventions Patient/Family Education, Bowel Management, Disease Management/Prevention, Pain Management, Medication Management, Bladder Management, Discharge Planning  PT interventions Ambulation/gait training, Balance/vestibular training, Cognitive remediation/compensation, Disease management/prevention, Discharge planning, DME/adaptive equipment instruction, Functional mobility training, Functional electrical stimulation, Patient/family education, Neuromuscular re-education, Splinting/orthotics, Therapeutic Activities, Therapeutic Exercise, Visual/perceptual remediation/compensation, Stair training, UE/LE Strength taining/ROM, Wheelchair propulsion/positioning  OT Interventions Training and development officer, Community reintegration, Discharge planning, Disease mangement/prevention, Engineer, drilling, Functional mobility training, Neuromuscular re-education, Pain management, Patient/family education, Psychosocial support, Self Care/advanced ADL retraining, Therapeutic Activities, Therapeutic Exercise, UE/LE Strength taining/ROM, UE/LE Coordination activities, Visual/perceptual remediation/compensation  SLP Interventions Cognitive remediation/compensation, Functional tasks, Cueing hierarchy, Internal/external aids, Multimodal communication approach, Speech/Language facilitation, Patient/family education  TR Interventions Recreation/leisure participation, Balance/Vestibular training, functional mobility, therapeutic activities, UE/LE strength/coordination, w/c mobility, community reintegration, pt/family education, adaptive equipment instruction/use, discharge planning, psychosocial support  SW/CM Interventions Discharge Planning, Barrister's clerk, Patient/Family Education    Team Discharge Planning: Destination: PT-Home ,OT- Home , SLP-Home Projected Follow-up: PT-Outpatient PT,  Other (comment) (intermittent assist), OT-  Home health OT, Outpatient OT (HH vs OP TBD), SLP- (TBD pending progress made while inpatient ) Projected Equipment Needs: PT-To be determined, OT- Tub/shower bench, SLP-None recommended by SLP Equipment Details: PT-Pt owns no personal equipment., OT-  Patient/family involved in discharge planning: PT- Patient,  OT-Patient, SLP-Patient  MD ELOS: 15-18 days Medical Rehab Prognosis:  Excellent Assessment: The patient has been admitted for CIR therapies with the  diagnosis of left ICH. The team will be addressing functional mobility, strength, stamina, balance, safety, adaptive techniques and equipment, self-care, bowel and bladder mgt, patient and caregiver education, NMR, cognitive perceptual rx, communication, ego support, community reintegration. Goals have been set at mod I for mobility and self-care and supervision for cognition.    Meredith Staggers, MD, FAAPMR      See Team Conference Notes for weekly updates to the plan of care
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
## 2014-11-18 NOTE — Progress Notes (Signed)
Occupational Therapy Session Note  Patient Details  Name: Brenda Blanchard MRN: [id number] Date of Birth: [birth year]  Today's Date: 11/18/2014 OT Individual Time: 0930-1030 OT Individual Time Calculation (min): 60 min    Short Term Goals: Week 1:  OT Short Term Goal 1 (Week 1): Pt will complete toilet transfer with mod assist ambulating with RW OT Short Term Goal 2 (Week 1): Pt will complete UB dressing with min assist OT Short Term Goal 3 (Week 1): Pt will complete LB dressing with mod assist OT Short Term Goal 4 (Week 1): Pt will complete 2 grooming tasks in standing with min assist for standing balance and use of RUE as dominant  Skilled Therapeutic Interventions/Progress Updates:    Engaged in ADL retraining with focus on functional transfers, sit <> stand, standing balance, and increased attention to/awareness of RUE during self-care tasks.  Engaged in bathing in room shower with use of shower chair to increase safety.  Pt attempted to wash buttocks in sitting with lateral leans with Rt buttocks starting to slide off chair, requiring mod assist to correct with pt reporting "I'm not going to fall", pt with decreased problem solving and awareness during bathing in shower.  Pt tends to move quickly during transfers and self-care tasks with increases her risk of falling.  Tactile cues at trunk and Rt knee to promote upright standing while pulling up pants over hips.  Continues to have decreased awareness of RUE, requiring mod verbal cues to locate UE prior to mobility or transfers.  Therapy Documentation Precautions:  Precautions Precautions: Fall Restrictions Weight Bearing Restrictions: No General:   Vital Signs: Therapy Vitals Pulse Rate: (!) 53 BP: (!) 152/60 mmHg Patient Position (if appropriate): Sitting Oxygen Therapy SpO2: 100 % O2 Device: Not Delivered Pain: Pain Assessment Pain Assessment: No/denies pain  See FIM for current functional status  Therapy/Group: Individual  Therapy  [name] 11/18/2014, 10:47 AM

## 2014-11-18 NOTE — Progress Notes (Signed)
Physical Therapy Session Note  Patient Details  Name: Brenda Blanchard MRN: [id number] Date of Birth: [birth year]  Today's Date: 11/18/2014 PT Individual Time: 0930-1030    PT Individual Time (min): 60 min  Short Term Goals: Week 1:  PT Short Term Goal 1 (Week 1): Pt will perform supine <> sit with HOB flat using rail with min A, 25% cueing. PT Short Term Goal 2 (Week 1): Pt will transfer from bed <> w/c with min A and 25% cueing. PT Short Term Goal 3 (Week 1): Pt will perform functional ambulation x30' with Max A of single therapist. PT Short Term Goal 4 (Week 1): Pt will negotiate 3 stairs with 1 rail and Max A of single therapist. PT Short Term Goal 5 (Week 1): Pt will perform w/c mobility x75' with min A and 25% cueing.  Skilled Therapeutic Interventions/Progress Updates:     Pt received semi reclined in bed; agreeable to therapy. Session focused on bed mobility, functional transfers. Pt performed supine >sit (to R side for increased weight bearing, proprioception) with HOB flat using rail with min A, max multimodal cueing for sequencing, technique with emphasis on attention to RUE/LE. Pt initially required min A to prevent LOB to R side while seated EOB. Performed squat pivot transfer from bed > w/c with mod A, manual facilitation of anterior weight shift, multimodal cueing for setup, technique.When pulling up underwear, ptt required max A for sit>stand from w/c with single UE at sink; tactile cueing at R knee for proprioceptive input. Pt required Total A for dynamic standing with bilat UE support while donning underwear.  Per MD order for SBP parameters, notified RN of resting BP of 185/68. RN present soon thereafter to administer medication.  Transported pt to gym in w/c with total A for energy conservation. Performed blocked practice of squat pivot transfers from w/c<>mat table with mod to max A, max verbal/tactile cueing for RUE/LE strengthening, transfer setup, and full anterior weight shift.  See below for detailed description of NMR. Session ended in pt room, where pt was left seated in w/c with all needs within reach,  Therapy Documentation Precautions:  Precautions Precautions: Fall Restrictions Weight Bearing Restrictions: No Vital Signs: Therapy Vitals Temp: 98.5 F (36.9 C) Temp Source: Oral Pulse Rate: 64 Resp: 20 BP: (!) 185/68 mmHg (RN notified) Patient Position (if appropriate): Sitting Oxygen Therapy SpO2: 99 % O2 Device: Not Delivered Pain: Pain Assessment Pain Assessment: No/denies pain NMR: Static standing with mirror positioned anterior/left to pt for visual feedback, pt required max to total A for standing balance, max multimodal cueing for upright posture, attention to RLE.  See FIM for current functional status  Therapy/Group: Individual Therapy  Hobble, Malva Cogan 11/18/2014, 8:50 AM

## 2014-11-18 NOTE — Progress Notes (Signed)
Physical Therapy Session Note  Patient Details  Name: Brenda Blanchard MRN: [id number] Date of Birth: 1934/07/02  Today's Date: 11/18/2014 PT Individual Time: 1530-1600 PT Individual Time Calculation (min): 30 min   Short Term Goals: Week 1:  PT Short Term Goal 1 (Week 1): Pt will perform supine <> sit with HOB flat using rail with min A, 25% cueing. PT Short Term Goal 2 (Week 1): Pt will transfer from bed <> w/c with min A and 25% cueing. PT Short Term Goal 3 (Week 1): Pt will perform functional ambulation x30' with Max A of single therapist. PT Short Term Goal 4 (Week 1): Pt will negotiate 3 stairs with 1 rail and Max A of single therapist. PT Short Term Goal 5 (Week 1): Pt will perform w/c mobility x75' with min A and 25% cueing.  Skilled Therapeutic Interventions/Progress Updates:   Pt received sitting in wheelchair, handoff from SLP. Pt performed multiple sit <> stand transfers from wheelchair and mat table with mod-max A and total verbal/tactile cues for safe hand placement. Pt continuously pulled up on RW front bar in spite of cues to push up from mat table. Gait training x 5 ft using RW with initial max A but required +2 assist to steer RW to turn in order to safely sit on mat table. Pt with increased difficulty advancing RW during gait. Seated BP 205/95 after ambulation > seated BP 178/74 after short rest. After sit <> stand transfers, pt's seated BP 195/96. Pt performed squat pivot transfer mat table > wheelchair with mod A and left sitting in wheelchair with all needs within reach. RN, Engineer, manufacturing, and MD notified of pt's vitals.   Therapy Documentation Precautions:  Precautions Precautions: Fall Restrictions Weight Bearing Restrictions: No Vital Signs: Therapy Vitals Pulse Rate: 64 BP: (!) 195/96 mmHg Patient Position (if appropriate): Sitting (after standing) Oxygen Therapy SpO2: 100 % O2 Device: Not Delivered Pain: Pain Assessment Pain Assessment: No/denies pain Locomotion  : Ambulation Ambulation/Gait Assistance: 2: Max assist;1: +2 Total assist   See FIM for current functional status  Therapy/Group: Individual Therapy  Laretta Alstrom 11/18/2014, 4:30 PM

## 2014-11-18 NOTE — Progress Notes (Signed)
Speech Language Pathology Daily Session Note  Patient Details  Name: Brenda Blanchard MRN: [id number] Date of Birth: 09-Aug-1934  Today's Date: 11/18/2014 SLP Individual Time: 1430-1500 SLP Individual Time Calculation (min): 30 min  Short Term Goals: Week 1: SLP Short Term Goal 1 (Week 1): Pt will improve semi-complex word finding to improve functional communication in conversations over 80% of observable opportunities wtih supervision  SLP Short Term Goal 2 (Week 1): Pt will improve semi-complex functional problem solving with supervision over 80% of observable opportunities.  SLP Short Term Goal 3 (Week 1): Pt will improve recall of daily information via compensatory aids for 80% accuracy with supervision.   Skilled Therapeutic Interventions: Skilled treatment session focused on addressing word finding strategies. SLP educated patient on various strategies with handout and discussion and then emphasized use of visualization and description in a structured naming task with Min verbal cues.  Patient verbalized appreciation and stated that she will work on utilizing these strategies throughout her day.  Continue with current plan of care.    FIM:  Comprehension Comprehension Mode: Auditory Comprehension: 6-Follows complex conversation/direction: With extra time/assistive device Expression Expression Mode: Verbal Expression: 5-Expresses basic needs/ideas: With extra time/assistive device Social Interaction Social Interaction: 6-Interacts appropriately with others with medication or extra time (anti-anxiety, antidepressant). Problem Solving Problem Solving: 4-Solves basic 75 - 89% of the time/requires cueing 10 - 24% of the time Memory Memory: 3-Recognizes or recalls 50 - 74% of the time/requires cueing 25 - 49% of the time  Pain Pain Assessment Pain Assessment: No/denies pain  Therapy/Group: Individual Therapy  Carmelia Roller., CCC-SLP [phone]  [place] 11/18/2014, 4:32  PM

## 2014-11-18 NOTE — Progress Notes (Signed)
Notified Dan Angiulli, PA of pt's BP of 171/74 and pulse of 55. Ordered to give scheduled Clonidine 0.3 mg. Pt is asymptomatic. Will continue to monitor. Kennieth Francois, RN

## 2014-11-19 ENCOUNTER — Inpatient Hospital Stay (HOSPITAL_COMMUNITY): Payer: Medicare Other | Admitting: Speech Pathology

## 2014-11-19 ENCOUNTER — Inpatient Hospital Stay (HOSPITAL_COMMUNITY): Payer: Medicare Other | Admitting: Physical Therapy

## 2014-11-19 ENCOUNTER — Inpatient Hospital Stay (HOSPITAL_COMMUNITY): Payer: Medicare Other | Admitting: Occupational Therapy

## 2014-11-19 NOTE — Progress Notes (Signed)
Occupational Therapy Session Note  Patient Details  Name: Brenda Blanchard MRN: [id number] Date of Birth: 08-Apr-1934  Today's Date: 11/19/2014 OT Individual Time: 0930-1030 OT Individual Time Calculation (min): 60 min    Short Term Goals: Week 1:  OT Short Term Goal 1 (Week 1): Pt will complete toilet transfer with mod assist ambulating with RW OT Short Term Goal 2 (Week 1): Pt will complete UB dressing with min assist OT Short Term Goal 3 (Week 1): Pt will complete LB dressing with mod assist OT Short Term Goal 4 (Week 1): Pt will complete 2 grooming tasks in standing with min assist for standing balance and use of RUE as dominant  Skilled Therapeutic Interventions/Progress Updates:    Engaged in ADL retraining with focus on functional transfers, sit <> stand, standing balance, and functional use of RUE during self-care tasks.  Pt received seated in w/c.  Completed stand pivot transfer w/c > toilet with mod assist with cueing for technique and safety.  Post toileting, pt completed stand pivot transfer toilet > w/c > room shower with use of grab bars and mod assist for safety due to quick movements.  Encouraged pt to slow down during shower to increase safety.  Bathed buttocks in standing this session with use of grab bar for steady assist and therapist providing tactile cues and manual facilitation to promote midline standing as pt tends to weight shift to Rt (hemi) side in standing with no awareness.  Dressing completed at sit > stand level at sink with pt demonstrating increased carryover of education regarding foot placement from prior sessions and recall of hemi-dressing technique.  Pt required increased time with fastening bra due to decreased grasp and sensation but able to complete task this session, also able to tie shoes with increased time.  9 hole peg test Lt: 39 seconds and Rt: 50 seconds with some over and undershooting and decreased coordination when grasping pegs on Rt.  Pt with report of  poor handwriting.  Pt would benefit from [facility].  Therapy Documentation Precautions:  Precautions Precautions: Fall Restrictions Weight Bearing Restrictions: No General:   Vital Signs: Therapy Vitals Pulse Rate: 66 BP: (!) 171/76 mmHg Patient Position (if appropriate): Sitting (Resting) Oxygen Therapy SpO2: 100 % O2 Device: Not Delivered Pain: Pain Assessment Pain Assessment: No/denies pain  See FIM for current functional status  Therapy/Group: Individual Therapy  [name] 11/19/2014, 10:48 AM

## 2014-11-19 NOTE — Progress Notes (Signed)
Physical Therapy Session Note  Patient Details  Name: Brenda Blanchard MRN: [id number] Date of Birth: Jun 18, 1934  Today's Date: 11/19/2014 PT Individual Time: 0830-0930 PT Individual Time Calculation (min): 60 min   Short Term Goals: Week 1:  PT Short Term Goal 1 (Week 1): Pt will perform supine <> sit with HOB flat using rail with min A, 25% cueing. PT Short Term Goal 2 (Week 1): Pt will transfer from bed <> w/c with min A and 25% cueing. PT Short Term Goal 3 (Week 1): Pt will perform functional ambulation x30' with Max A of single therapist. PT Short Term Goal 4 (Week 1): Pt will negotiate 3 stairs with 1 rail and Max A of single therapist. PT Short Term Goal 5 (Week 1): Pt will perform w/c mobility x75' with min A and 25% cueing.  Skilled Therapeutic Interventions/Progress Updates:    Pt received semi reclined in bed; agreeable to therapy. Session focused on functional weight shifting, promoting RLE weightbearing, postural control in standing, and ambulation. Pt performed supine > sit with supervision, HOB flat using rail. Performed squat pivot transfers from bed > w/c <> mat table with mod A with pt exhibiting between-session carryover of >50% of cueing provided for setup, w/c parts management. While donning underwear, pt performed sit <> stand with max A, mod cueing for bilat UE/LE placement prior to initiating transfer. Pt requires least assistance when R hand placed on sink, L hand on arm rest when standing from w/c. W/c mobility x75' in controlled environment with L hemi technique and min A for obstacle negotiation on R side, tactile cueing at L knee for weightbearing. Donned 3 lb ankle cuff weight at RLE for increased proprioceptive input, attention to RLE. See below for detailed description of NMR interventions. Transported pt to hallway in w/c, where pt performed gait x30'  In controlled environment with L rail and mod A for stability, +2A for w/c follow. Pt able to consistently advance and  stabilize RLE without assist/cueing during this trial. Postural alignment with standing/gait improved, as compared with previous sessions. Pt did require cueing for grading of movement, attention to RUE>RLE with mobility during thi session. Session ended in pt room,where pt was left seated in w/c with all needs within reach.   Therapy Documentation Precautions:  Precautions Precautions: Fall Restrictions Weight Bearing Restrictions: No Vital Signs: Therapy Vitals Temp: 98.1 F (36.7 C) Temp Source: Oral Pulse Rate: 66 Resp: 18 BP: (!) 171/76 mmHg Patient Position (if appropriate): Sitting (Resting) Oxygen Therapy SpO2: 100 % O2 Device: Not Delivered Pain: Pain Assessment Pain Assessment: No/denies pain NMR:   Neuromuscular Facilitation: Right;Upper Extremity;Lower Extremity;Activity to increase timing and sequencing;Activity to increase motor control;Activity to increase coordination;Activity to increase grading;Activity to increase sustained activation;Activity to increase lateral weight shifting;Activity to increase anterior-posterior weight shifting; Activity to increase attention to R hemi body, R visual field. Seated: RUE anterolateral reaching with tactile cueing at R knee, 3 lb weight at R ankle for increased proprioceptive input, symmetrical weightbearing. Transitioned to standing, performing RUE anterolateral reaching for functional use of RUE, functional weight shifting. Cueing focused on grading of movement, visually attending to RUE during reaching.  See FIM for current functional status  Therapy/Group: Individual Therapy  Stefano Gaul 11/19/2014, 4:23 PM

## 2014-11-19 NOTE — Progress Notes (Signed)
Speech Language Pathology Daily Session Note  Patient Details  Name: Brenda Blanchard MRN: [id number] Date of Birth: 02/26/1934  Today's Date: 11/19/2014 SLP Individual Time: 1430-1530 SLP Individual Time Calculation (min): 60 min  Short Term Goals: Week 1: SLP Short Term Goal 1 (Week 1): Pt will improve semi-complex word finding to improve functional communication in conversations over 80% of observable opportunities wtih supervision  SLP Short Term Goal 2 (Week 1): Pt will improve semi-complex functional problem solving with supervision over 80% of observable opportunities.  SLP Short Term Goal 3 (Week 1): Pt will improve recall of daily information via compensatory aids for 80% accuracy with supervision.   Skilled Therapeutic Interventions:  Pt was seen for skilled ST targeting cognitive-linguistic goals.  Upon arrival, pt was seated upright in wheelchair, awake, alert, and agreeable to participate in [place].  SLP facilitated the session with a semi-complex generative naming task targeting functional word finding.  Prior to initiating task, SLP reviewed and reinforced compensatory strategies targeted during yesterday's therapy session as pt did not recall them.  Pt completed the abovementioned activity for 100% accuracy with supervision-mod I use of strategies.  SLP then facilitated the session with a structured medication management task targeting recall of daily information.  Pt recalled 3 out of 3 targeted, scheduled medications with supervision question cues.  Pt presented with questions about how to improve her cognition during her free time; therefore, SLP initiated skilled education related to activities for cognitive reorganization and provided pt with several crossword puzzles, word searches, and a magazine, as those were activities that she enjoyed prior to admission.  Continue per current plan of care.    FIM:  Comprehension Comprehension Mode: Auditory Comprehension: 6-Follows complex  conversation/direction: With extra time/assistive device Expression Expression Mode: Verbal Expression: 5-Expresses basic needs/ideas: With extra time/assistive device Social Interaction Social Interaction: 6-Interacts appropriately with others with medication or extra time (anti-anxiety, antidepressant). Problem Solving Problem Solving: 4-Solves basic 75 - 89% of the time/requires cueing 10 - 24% of the time Memory Memory: 4-Recognizes or recalls 75 - 89% of the time/requires cueing 10 - 24% of the time  Pain Pain Assessment Pain Assessment: No/denies pain  Therapy/Group: Individual Therapy  Anzley Dibbern, Selinda Orion 11/19/2014, 5:09 PM
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
## 2014-11-20 ENCOUNTER — Inpatient Hospital Stay (HOSPITAL_COMMUNITY): Payer: Medicare Other | Admitting: Physical Therapy

## 2014-11-20 ENCOUNTER — Inpatient Hospital Stay (HOSPITAL_COMMUNITY): Payer: Medicare Other | Admitting: Occupational Therapy

## 2014-11-20 ENCOUNTER — Inpatient Hospital Stay (HOSPITAL_COMMUNITY): Payer: Medicare Other | Admitting: Speech Pathology

## 2014-11-20 DIAGNOSIS — R339 Retention of urine, unspecified: Secondary | ICD-10-CM

## 2014-11-20 MED ORDER — CLONIDINE HCL 0.1 MG PO TABS
0.1000 mg | ORAL_TABLET | ORAL | Status: AC
  Administered 2014-11-20: 0.1 mg via ORAL
  Filled 2014-11-20: qty 1

## 2014-11-20 NOTE — Progress Notes (Signed)
Speech Language Pathology Daily Session Note  Patient Details  Name: Brenda Blanchard MRN: [id number] Date of Birth: December 12, 1933  Today's Date: 11/20/2014 SLP Individual Time: 1300-1330 SLP Individual Time Calculation (min): 30 min  Short Term Goals: Week 1: SLP Short Term Goal 1 (Week 1): Pt will improve semi-complex word finding to improve functional communication in conversations over 80% of observable opportunities wtih supervision  SLP Short Term Goal 2 (Week 1): Pt will improve semi-complex functional problem solving with supervision over 80% of observable opportunities.  SLP Short Term Goal 3 (Week 1): Pt will improve recall of daily information via compensatory aids for 80% accuracy with supervision.   Skilled Therapeutic Interventions: Skilled treatment session focused on addressing verbal expression goals with emphasis on use of word finding strategies.  Patient participated in a structured naming task and required Min verbal cues to complete the categorical, generative naming task with use of strategies during moments of anomia.  Continue with current plan of care.     FIM:  Comprehension Comprehension Mode: Auditory Comprehension: 6-Follows complex conversation/direction: With extra time/assistive device Expression Expression Mode: Verbal Expression: 5-Expresses basic needs/ideas: With extra time/assistive device Social Interaction Social Interaction: 6-Interacts appropriately with others with medication or extra time (anti-anxiety, antidepressant). Problem Solving Problem Solving: 4-Solves basic 75 - 89% of the time/requires cueing 10 - 24% of the time Memory Memory: 4-Recognizes or recalls 75 - 89% of the time/requires cueing 10 - 24% of the time  Pain Pain Assessment Pain Assessment: No/denies pain  Therapy/Group: Individual Therapy  Carmelia Roller., [place]  [place] 11/20/2014, 3:32 PM
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
## 2014-11-20 NOTE — Progress Notes (Signed)
Physical Therapy Session Note  Patient Details  Name: Brenda Blanchard MRN: [id number] Date of Birth: 15-Nov-1933  Today's Date: 11/20/2014 PT Individual Time: 1100-1200 PT Individual Time Calculation (min): 60 min   Short Term Goals: Week 1:  PT Short Term Goal 1 (Week 1): Pt will perform supine <> sit with HOB flat using rail with min A, 25% cueing. PT Short Term Goal 2 (Week 1): Pt will transfer from bed <> w/c with min A and 25% cueing. PT Short Term Goal 3 (Week 1): Pt will perform functional ambulation x30' with Max A of single therapist. PT Short Term Goal 4 (Week 1): Pt will negotiate 3 stairs with 1 rail and Max A of single therapist. PT Short Term Goal 5 (Week 1): Pt will perform w/c mobility x75' with min A and 25% cueing.  Skilled Therapeutic Interventions/Progress Updates:    Pt received seated in w/c; agreeable to therapy. Session focused on increasing stability/independence with ambulation. Pt performed w/c mobility x65' in controlled environment with L hemi technique and supervision, tactile cueing at L knee for increased weightbearing. Attention to R visual field during w/c mobility noted to be improved during this session.   The following gait training interventions were performed using LiteGait over treadmill with harness biased to increased lateral weight shift to L side and to promote bilat hip extension: x216' with tactile cueing for upright posture (focus on hip extension), lateral weight shift to L side, increasing RLE step length, increasing L hip/knee flexion and L ankle dorsiflexion. Mirror positioned to R of pt for visual feedback, postural awareness. Pt exhibited effective return demonstration of 75% of tactile cueing provided. Therefore, transitioned to gait training without Lite Gait. Ambulation 2 x15' with hemi walker using step-to pattern requiring mod to max A for stability, multimodal cueing for sequencing/technique, lateral weight shifting, and posture. Transitioned to  gait x70' with rolling walker, R hand orthosis with mod A for initial 55', max A for final 15' due to pt fatigue. Departed with pt seated in w/c with lunch tray set up and all needs within reach.  Therapy Documentation Precautions:  Precautions Precautions: Fall Restrictions Weight Bearing Restrictions: No General:   Vital Signs: Therapy Vitals BP: (!) 190/72 mmHg (With activity) Patient Position (if appropriate): Sitting Pain: Pain Assessment Pain Assessment: No/denies pain Locomotion : Ambulation Ambulation/Gait Assistance: 3: Mod assist;2: Max Financial controller Distance: 65   See FIM for current functional status  Therapy/Group: Individual Therapy  Stefano Gaul 11/20/2014, 3:42 PM

## 2014-11-20 NOTE — Progress Notes (Signed)
Occupational Therapy Session Note  Patient Details  Name: Brenda Blanchard MRN: [id number] Date of Birth: June 16, 1934  Today's Date: 11/20/2014 OT Individual Time: 0930-1030 and 1400-1430 OT Individual Time Calculation (min): 60 min and 30 min   Short Term Goals: Week 1:  OT Short Term Goal 1 (Week 1): Pt will complete toilet transfer with mod assist ambulating with RW OT Short Term Goal 2 (Week 1): Pt will complete UB dressing with min assist OT Short Term Goal 3 (Week 1): Pt will complete LB dressing with mod assist OT Short Term Goal 4 (Week 1): Pt will complete 2 grooming tasks in standing with min assist for standing balance and use of RUE as dominant  Skilled Therapeutic Interventions/Progress Updates:    1) Engaged in ADL retraining with focus on functional transfers, sit <> stand, standing balance, and functional use of RUE during self-care tasks. Pt in bed upon arrival, performed squat pivot bed > w/c with mod assist with cues for sequencing, technique, and safety.  Pt demonstrating improved control with stand pivot transfer to toilet and shower chair in room shower (with use of grab bars).  Bathing completed at sit > stand level when washing buttocks, therapist provided min/steady assist in standing and manual facilitate to promote midline standing as pt tends to weight shift to Rt, hemi side.  Dressing completed at sit > stand level at sink with improved control with sit > stand and standing balance while pulling underwear and pants over hips with min/steady assist.  Provided pt with [facility] HEP with demonstration of activities to improve coordination and motor control to allow pt to focus on RUE between therapy sessions.  2) Engaged in [place], sit <> stand, and standing balance in therapy gym.  Engaged in [employer] [facility] with focus on in-hand manipulation, translation, stacking, and reaching with RUE, pt required increased time with stacking and translation.  Progressed to above tasks in standing with  focus on sit > stand with appropriate hand placement and weight shifting.  Reaching task in standing to Lt to promote weight shift to Lt as pt tends to shift to Rt in standing.  Pt min assist with sit > stand and all standing balance tasks this session.  Returned to room, RN to assist to bathroom.  Therapy Documentation Precautions:  Precautions Precautions: Fall Restrictions Weight Bearing Restrictions: No General:   Vital Signs: Therapy Vitals Pulse Rate: (!) 55 BP: (!) 172/60 mmHg Patient Position (if appropriate): Lying Pain: Pain Assessment Pain Assessment: No/denies pain  See FIM for current functional status  Therapy/Group: Individual Therapy  [name] 11/20/2014, 10:40 AM

## 2014-11-21 ENCOUNTER — Inpatient Hospital Stay (HOSPITAL_COMMUNITY): Payer: Medicare Other | Admitting: *Deleted

## 2014-11-21 ENCOUNTER — Inpatient Hospital Stay (HOSPITAL_COMMUNITY): Payer: Medicare Other

## 2014-11-21 DIAGNOSIS — R200 Anesthesia of skin: Secondary | ICD-10-CM | POA: Insufficient documentation

## 2014-11-21 DIAGNOSIS — R062 Wheezing: Secondary | ICD-10-CM

## 2014-11-21 LAB — GLUCOSE, CAPILLARY: GLUCOSE-CAPILLARY: 102 mg/dL — AB (ref 70–99)

## 2014-11-21 NOTE — Significant Event (Signed)
Rapid Response Event Note  Overview:    Called to see patient at [phone] for new onset right acial numbness and right arm 'doesn't feel right.'  Initial Focused Assessment: Upon arrival patient is alert, oriented, talkative. Color good; breathing easily. No complaints of pain; states her right side of her face feels numb, and her right arm does not feel right. Adm for Uncontrolled HTN with cerebral bleed. BP manually/palpated 827 systolic. RN in room to give AM medications.  Interventions: Advised RN to keep patient in bed, notify MD, question need for CT of head. Patient is stable at  1005.   Event Summary:   at      at          Baron Hamper
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
## 2014-11-21 NOTE — Progress Notes (Signed)
Patient c/o right face numbness, and that her right arm feels different. "The right side of my face feels different compare to the other side. My right arm just don't feel right." Patient, upon assessment, was alert and oriented x4. Speech clear, no noted facial droop, strong right hand grip, moderate left hand grip. Vitals: 208/85(Dinamap) 146/100 (Manually), 99.1, 65, 18 regular and non labored, 99 RA. CBG: 102. Denied pain. Rapid Response Nurse was at bedside. Dr. Alain Marion made aware. CT scan without contrast showed slight decrease in size of L. Thalamic Hemorrhage. B/P at 11 A: 170/63.... Hemoglobin and Hematocrit are trending down. New order to collect CBC tomorrow, guiac x1. Will continue to monitor. Brenda Blanchard A. Brenda Blanchard

## 2014-11-22 ENCOUNTER — Inpatient Hospital Stay (HOSPITAL_COMMUNITY): Payer: Medicare Other | Admitting: Rehabilitation

## 2014-11-22 ENCOUNTER — Inpatient Hospital Stay (HOSPITAL_COMMUNITY): Payer: Medicare Other | Admitting: Speech Pathology

## 2014-11-22 ENCOUNTER — Encounter (HOSPITAL_COMMUNITY): Payer: Medicare Other | Admitting: Occupational Therapy

## 2014-11-22 ENCOUNTER — Inpatient Hospital Stay (HOSPITAL_COMMUNITY): Payer: Medicare Other

## 2014-11-22 DIAGNOSIS — R208 Other disturbances of skin sensation: Secondary | ICD-10-CM

## 2014-11-22 LAB — CBC WITH DIFFERENTIAL/PLATELET
Basophils Absolute: 0 10*3/uL (ref 0.0–0.1)
Basophils Relative: 0 % (ref 0–1)
Eosinophils Absolute: 0.1 10*3/uL (ref 0.0–0.7)
Eosinophils Relative: 1 % (ref 0–5)
HEMATOCRIT: 34.3 % — AB (ref 36.0–46.0)
HEMOGLOBIN: 11 g/dL — AB (ref 12.0–15.0)
Lymphocytes Relative: 21 % (ref 12–46)
Lymphs Abs: 1.7 10*3/uL (ref 0.7–4.0)
MCH: 25.6 pg — ABNORMAL LOW (ref 26.0–34.0)
MCHC: 32.1 g/dL (ref 30.0–36.0)
MCV: 80 fL (ref 78.0–100.0)
MONO ABS: 0.6 10*3/uL (ref 0.1–1.0)
Monocytes Relative: 8 % (ref 3–12)
NEUTROS PCT: 70 % (ref 43–77)
Neutro Abs: 5.6 10*3/uL (ref 1.7–7.7)
PLATELETS: 281 10*3/uL (ref 150–400)
RBC: 4.29 MIL/uL (ref 3.87–5.11)
RDW: 14.7 % (ref 11.5–15.5)
WBC: 8 10*3/uL (ref 4.0–10.5)

## 2014-11-22 LAB — GLUCOSE, CAPILLARY: GLUCOSE-CAPILLARY: 98 mg/dL (ref 70–99)

## 2014-11-22 NOTE — Progress Notes (Signed)
Occupational Therapy Session Note  Patient Details  Name: Brenda Blanchard MRN: [id number] Date of Birth: Mar 15, 1934  Today's Date: 11/22/2014 OT Individual Time:  -    14:00-15:00 (27min)   Short Term Goals: Week 1:  OT Short Term Goal 1 (Week 1): Pt will complete toilet transfer with mod assist ambulating with RW OT Short Term Goal 2 (Week 1): Pt will complete UB dressing with min assist OT Short Term Goal 3 (Week 1): Pt will complete LB dressing with mod assist OT Short Term Goal 4 (Week 1): Pt will complete 2 grooming tasks in standing with min assist for standing balance and use of RUE as dominant  Skilled Therapeutic Interventions/Progress Updates:    1:1 self care retraining at shower level. Focus of session: right UE use at nondominant level, functional transfers with RW, sit to stands, standing balance without UE support, functional ambulation with RW with hand orthosis. At beginning of session pt able to demonstrate her [facility] home program with min VC for normal movement at the shoulder to avoid "hiking her shoulder." Pt ambulated to the bathroom with mod A and max A to turn around to sit on toilet. Pt required max VC for safety with turning and remaining inside the RW and for proper hand placement. Pt benefited from trunk tactuiles cues to maintain an upright trunk and head posture. Dressed sit to stand; demonstrating labored breathing with flexed trunk posture with donning socks but with rest breaks able to perform with steadying A. Pt with fatigue at end of session and decline to stand to perform grooming tasks. Pt able to complete tasks with extra time in seated position. Discussed POC and goals.   Therapy Documentation Precautions:  Precautions Precautions: Fall Restrictions Weight Bearing Restrictions: No    Vital Signs: Therapy Vitals Temp: 98.6 F (37 C) Temp Source: Oral Pulse Rate: (!) 55 Oxygen Therapy SpO2: 100 % O2 Device: Not Delivered Pain: Pain Assessment Pain  Assessment: No/denies pain  See FIM for current functional status  Therapy/Group: Individual Therapy  Willeen Cass [facility] 11/22/2014, 2:43 PM

## 2014-11-22 NOTE — Progress Notes (Signed)
   11/22/14 1953  What Happened  Was fall witnessed? No  Was patient injured? No  Patient found on floor  Found by Staff-comment (NT- Sarah )  Stated prior activity to/from bed, chair, or stretcher  Follow Up  MD notified Reesa Chew   Time MD notified [phone]  Family notified Yes-comment (Sister notified via phone call )  Time family notified [phone]  Additional tests No  Progress note created (see row info) Yes  Adult Fall Risk Assessment  Risk Factor Category (scoring not indicated) Fall has occurred during this admission (document High fall risk)  Patient's Fall Risk High Fall Risk (>13 points)  Adult Fall Risk Interventions  Required Bundle Interventions *See Row Information* High fall risk - low, moderate, and high requirements implemented  Additional Interventions Fall risk signage  Vitals  Temp 99.4 F (37.4 C)  Temp Source Oral  BP (!) 173/90 mmHg  BP Location Left Arm  BP Method Automatic  Patient Position (if appropriate) Sitting  Pulse Rate 65  Pulse Rate Source Dinamap  Resp 17  Oxygen Therapy  SpO2 96 %  O2 Device Room Air  Pain Assessment  Pain Assessment No/denies pain  PCA/Epidural/Spinal Assessment  Respiratory Pattern Regular  Neurological  Neuro (WDL) X (ICH)  Level of Consciousness Alert  Orientation Level Oriented X4  Cognition Appropriate at baseline  Speech Clear  Pupil Assessment  No  Facial Symmetry Asymmetrical right  R Hand Grip Moderate  L Hand Grip Strong  R Foot Dorsiflexion Moderate  L Foot Dorsiflexion Strong  R Foot Plantar Flexion Moderate  L Foot Plantar Flexion Strong  RUE Motor Response Purposeful movement  RUE Sensation Tingling;Decreased  RUE Motor Strength 4  LUE Motor Response Purposeful movement  LUE Sensation Full sensation  LUE Motor Strength 5  RLE Motor Response Purposeful movement  RLE Sensation Full sensation  RLE Motor Strength 4  LLE Motor Response Purposeful movement  LLE Sensation Full sensation  LLE  Motor Strength 5  Neuro Symptoms None  Musculoskeletal  Musculoskeletal (WDL) X  Assistive Device Wheelchair  Generalized Weakness Yes  Weight Bearing Restrictions No  Musculoskeletal Details  RUE Limited movement;Weakness  RLE Full movement  Integumentary  Integumentary (WDL) X  Skin Color Appropriate for ethnicity  Skin Condition Dry  Skin Integrity Ecchymosis  Ecchymosis Location Abdomen  Skin Turgor Non-tenting   Pt was found on floor by her wheelchair during shift change at approximately 1945. The fall was unwitnessed by staff. Pt stated that she "slid from her wheelchair while trying to pick up an item that fell on the floor". Pt did not have lap tray on at the time of the fall. Pt stated that she did not hit her head. No signs of head injury are present. She is alert and oriented. ROM  Was performed on all joints and no injuries were found. Pt stated that she was "fine" and reports her pain at 0 out of 10. Vital signs were normal except for a high blood pressure which was treated with a BP medication. Pt was helped back into her wheelchair and quick release belt was put on pt. RN attempted to reach MD on call Reesa Chew at [phone]. RN left a voicemail and will continue to attempt to reach MD. Pt's Sister was notified of fall via phone call. RN will contiune to monitor pt for signs of injury and will continue Q4 vital signs.

## 2014-11-22 NOTE — Progress Notes (Signed)
Physical Therapy Session Note  Patient Details  Name: Brenda Blanchard MRN: [id number] Date of Birth: 1934/04/22  Today's Date: 11/22/2014 PT Individual Time: 1030-1130 PT Individual Time Calculation (min): 60 min   Short Term Goals: Week 1:  PT Short Term Goal 1 (Week 1): Pt will perform supine <> sit with HOB flat using rail with min A, 25% cueing. PT Short Term Goal 2 (Week 1): Pt will transfer from bed <> w/c with min A and 25% cueing. PT Short Term Goal 3 (Week 1): Pt will perform functional ambulation x30' with Max A of single therapist. PT Short Term Goal 4 (Week 1): Pt will negotiate 3 stairs with 1 rail and Max A of single therapist. PT Short Term Goal 5 (Week 1): Pt will perform w/c mobility x75' with min A and 25% cueing.  Skilled Therapeutic Interventions/Progress Updates:  Patient sitting in wheelchair working on fine motor skills with right UE upon entering room. Patient donned left shoe needing assist with tying. Patient needed assist with donning and tying right shoe. Bp sitting 172/81. Patient ambulated with rolling walker and hand orthosis on right 90 feet with mod assist. Patient required cueing to maintain LE's within walker. Patient with decreased control of right LE during swing and forceful foot flat contact with floor. Patient also required cueing and facilitation for anterior weight shift over right LE during stance. Bp post ambulation 182/70. Patient worked on Arts administrator for control of right LE with tapping up and down on step, side stepping, control stepping on target on floor with UE support. Patient required min assist with these activities for balance in standing. Patient with 1 major LOB to right required max assist to control descent to mat. Patient ambulated 86 feet with RW/orthosis/mod assist and same gait deviations as above. Patient left in wheelchair in room with all items in reach.  Therapy Documentation Precautions:  Precautions Precautions:  Fall Restrictions Weight Bearing Restrictions: No  Pain: Pain Assessment Pain Assessment: No/denies pain  Locomotion : Ambulation Ambulation/Gait Assistance: 3: Mod assist  See FIM for current functional status  Therapy/Group: Individual Therapy  Elder Love M 11/22/2014, 2:12 PM

## 2014-11-22 NOTE — Progress Notes (Signed)
Physical Therapy Session Note  Patient Details  Name: Brenda Blanchard MRN: [id number] Date of Birth: 11-19-1933  Today's Date: 11/22/2014 PT Individual Time: 1530-1600 PT Individual Time Calculation (min): 30 min   Short Term Goals: Week 1:  PT Short Term Goal 1 (Week 1): Pt will perform supine <> sit with HOB flat using rail with min A, 25% cueing. PT Short Term Goal 2 (Week 1): Pt will transfer from bed <> w/c with min A and 25% cueing. PT Short Term Goal 3 (Week 1): Pt will perform functional ambulation x30' with Max A of single therapist. PT Short Term Goal 4 (Week 1): Pt will negotiate 3 stairs with 1 rail and Max A of single therapist. PT Short Term Goal 5 (Week 1): Pt will perform w/c mobility x75' with min A and 25% cueing.  Skilled Therapeutic Interventions/Progress Updates:   Pt received sitting in w/c in room, agreeable to therapy.  Assisted with donning shoes prior to leaving room for time management.  Assisted pt to/from therapy gym, again for time management.  Skilled session focused on gait training with use of RW with R hand orthosis and NMR for trunk dissociation with scooting and reaching tasks, as well as trunk shortening and lengthening.  Performed 90' gait x 1 with RW as stated above.  Requires mod A with facilitation for upright posture, R glute activation during R stance, weight shifting and to increased forward translation of weight over RLE.  Pt able to advance RLE, however steps decreased due to fatigue as we continued.  Remainder of session focused on trunk dissociation with feet supported>unsupported with scooting task without UE support, retrieving items from under R hip, and while sitting on Kay bench on airdex pad.  Note improvement in hip/pelvic movement during session. Pt transferred back to w/c with min A.  Assisted back to room and left in w/c with all needs in reach.   Therapy Documentation Precautions:  Precautions Precautions: Fall Restrictions Weight Bearing  Restrictions: No   Vital Signs: Therapy Vitals Temp: 98.6 F (37 C) Temp Source: Oral Pulse Rate: (!) 55 Resp: 18 BP: (!) 178/66 mmHg Patient Position (if appropriate): Sitting Oxygen Therapy SpO2: 100 % O2 Device: Not Delivered Pain: Pain Assessment Pain Assessment: No/denies pain   Locomotion : Ambulation Ambulation/Gait Assistance: 3: Mod assist   See FIM for current functional status  Therapy/Group: Individual Therapy  Denice Bors 11/22/2014, 4:27 PM
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
## 2014-11-22 NOTE — Progress Notes (Signed)
Recreational Therapy Assessment and Plan  Patient Details  Name: Brenda Blanchard MRN: [id number] Date of Birth: 03-21-1934 Today's Date: 11/22/2014  Rehab Potential: Good ELOS: 2 weeks   Assessment Clinical Impression:  Problem List:  Patient Active Problem List   Diagnosis Date Noted  . Right hemiparesis 11/17/2014  . ICH (intracerebral hemorrhage) 11/16/2014  . Essential hypertension 11/16/2014  . Intracerebral hemorrhage of other cerebral location   . Stroke due to intracerebral hemorrhage 11/11/2014   Past Medical History:  Past Medical History  Diagnosis Date  . Hypertension   . Endometrial cancer   . Seasonal allergies    Past Surgical History:  Past Surgical History  Procedure Laterality Date  . Abdominal hysterectomy  2013  . Tonsillectomy and adenoidectomy     Assessment & Plan Clinical Impression: Brenda Blanchard is a 81 y.o. female with history of untreated HTN who was admitted on 11/11/14 with acute onset of right sided weakness, difficulty talking and right facial droop. SBP>200 at admission and patient treated with IV labetalol. CT head done revealing 20 x 22 mm area of hyperdense hemorrhage in the superior left thalamus, tracking into the posterior left corona radiata with intraventricular extension likely due to hypertensive bleed. Neurology recommended BP management with cardene drip and follow up CCT with stable bleed and no significant change in vasogenic edema. 2D echo with moderate LVH with EF 55-60% and trivial pericardial effusion. Patient with resultant right hemiparesis with mild ataxia, right inattention with visual deficits as well as transcortical aphasia. Patient transferred to CIR on 11/16/2014.  Pt presents with decreased activity tolerance, decreased functional mobility, decreased balance, decreased coordination, right inattention Limiting pt's independence with leisure/community pursuits.      Leisure  History/Participation Premorbid leisure interest/current participation: [place] store;Community - Travel (Comment);Games - Cross-word;Games - Music therapist Expression Interests: Music (Comment) Other Leisure Interests: Television;Reading;Cooking/Baking Leisure Participation Style: With Family/Friends Awareness of Community Resources: Good-identify 3 post discharge leisure resources Psychosocial / Spiritual Social interaction - Mood/Behavior: Cooperative Academic librarian Appropriate for Education?: Yes Recreational Therapy Orientation Orientation -Reviewed with patient: Available activity resources Strengths/Weaknesses Patient Strengths/Abilities: Willingness to participate;Active premorbidly Patient weaknesses: Physical limitations TR Patient demonstrates impairments in the following area(s): Edema;Endurance;Motor;Safety  Plan Rec Therapy Plan Is patient appropriate for Therapeutic Recreation?: Yes Rehab Potential: Good Treatment times per week: Min 1 time per week >20 minutes Estimated Length of Stay: 2 weeks TR Treatment/Interventions: Adaptive equipment instruction;1:1 session;Balance/vestibular training;Functional mobility training;Community reintegration;Patient/family education;Cognitive remediation/compensation;Recreation/leisure participation;Therapeutic activities;UE/LE Coordination activities;Therapeutic exercise  Recommendations for other services: None  Discharge Criteria: Patient will be discharged from TR if patient refuses treatment 3 consecutive times without medical reason.  If treatment goals not met, if there is a change in medical status, if patient makes no progress towards goals or if patient is discharged from hospital.  The above assessment, treatment plan, treatment alternatives and goals were discussed and mutually agreed upon: by patient  [place] 11/22/2014, 3:43 PM

## 2014-11-22 NOTE — Progress Notes (Signed)
Speech Language Pathology Daily Session Note  Patient Details  Name: Brenda Blanchard MRN: [id number] Date of Birth: [birth year]  Today's Date: 11/22/2014 SLP Individual Time: 0930-1000 SLP Individual Time Calculation (min): 30 min  Short Term Goals: Week 1: SLP Short Term Goal 1 (Week 1): Pt will improve semi-complex word finding to improve functional communication in conversations over 80% of observable opportunities wtih supervision  SLP Short Term Goal 2 (Week 1): Pt will improve semi-complex functional problem solving with supervision over 80% of observable opportunities.  SLP Short Term Goal 3 (Week 1): Pt will improve recall of daily information via compensatory aids for 80% accuracy with supervision.   Skilled Therapeutic Interventions:  Pt was seen for skilled ST targeting cognitive goals.  Upon arrival, pt was seated upright in wheelchair, awake, alert, and agreeable to participate in [place].  Pt reported still feeling slightly anxious about yesterday's rapid response event and stated that she had noted increased difficulty remembering the words to familiar prayers.  Pt also stated that she had difficulty recalling the results of yesterday's CT scan, which the doctor had reviewed with her earlier this morning.  Pt did not appear altered from her baseline known while inpatient other than somewhat increased anxiety.  SLP facilitated the session with a structured, familiar task targeting functional problem solving.  Pt completed the abovementioned task for ~80% accuracy with min assist verbal cues for mental flexibility and working memory.  Continue per current plan of care.    FIM:  Comprehension Comprehension Mode: Auditory Comprehension: 6-Follows complex conversation/direction: With extra time/assistive device Expression Expression Mode: Verbal Expression: 5-Expresses basic 90% of the time/requires cueing < 10% of the time. Social Interaction Social Interaction: 6-Interacts appropriately  with others with medication or extra time (anti-anxiety, antidepressant). Problem Solving Problem Solving: 4-Solves basic 75 - 89% of the time/requires cueing 10 - 24% of the time Memory Memory: 4-Recognizes or recalls 75 - 89% of the time/requires cueing 10 - 24% of the time  Pain Pain Assessment Pain Assessment: No/denies pain  Therapy/Group: Individual Therapy  Lisaanne Lawrie, Selinda Orion 11/22/2014, 12:34 PM

## 2014-11-23 ENCOUNTER — Inpatient Hospital Stay (HOSPITAL_COMMUNITY): Payer: Medicare Other | Admitting: Speech Pathology

## 2014-11-23 ENCOUNTER — Inpatient Hospital Stay (HOSPITAL_COMMUNITY): Payer: Medicare Other | Admitting: Occupational Therapy

## 2014-11-23 ENCOUNTER — Inpatient Hospital Stay (HOSPITAL_COMMUNITY): Payer: Medicare Other | Admitting: Physical Therapy

## 2014-11-23 LAB — CREATININE, SERUM
Creatinine, Ser: 1.01 mg/dL (ref 0.50–1.10)
GFR calc non Af Amer: 52 mL/min — ABNORMAL LOW (ref >90)
GFR, EST AFRICAN AMERICAN: 60 mL/min — AB (ref >90)
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
## 2014-11-23 NOTE — Progress Notes (Signed)
Occupational Therapy Session Note  Patient Details  Name: Brenda Blanchard MRN: [id number] Date of Birth: April 09, 1934  Today's Date: 11/23/2014 OT Individual Time: 1400-1500 OT Individual Time Calculation (min): 60 min    Short Term Goals: Week 1:  OT Short Term Goal 1 (Week 1): Pt will complete toilet transfer with mod assist ambulating with RW OT Short Term Goal 2 (Week 1): Pt will complete UB dressing with min assist OT Short Term Goal 3 (Week 1): Pt will complete LB dressing with mod assist OT Short Term Goal 4 (Week 1): Pt will complete 2 grooming tasks in standing with min assist for standing balance and use of RUE as dominant  Skilled Therapeutic Interventions/Progress Updates:    Engaged in ADL retraining with focus on functional transfers, sit <> stand, standing balance without UE support, and functional use of RUE during self-care tasks.  Pt received on toilet finishing toileting.  Performed hygiene with lateral leans while seated on commode.  Stand pivot transfer toilet > w/c > shower chair in room shower with min assist and heavy use of grab bars.  Bathing completed at sit > stand level, pt requiring min-mod tactile cues to promote increased awareness of midline as pt tends to lean to Rt in both sitting and increasingly in standing.  Discussed goals and progress towards with focus on increased standing balance and tolerance, with pt requesting to complete grooming tasks seated due to fatigue.    Therapy Documentation Precautions:  Precautions Precautions: Fall Restrictions Weight Bearing Restrictions: No General:   Vital Signs: Therapy Vitals Temp: 98.7 F (37.1 C) Temp Source: Oral Pulse Rate: (!) 55 Resp: 18 BP: (!) 152/60 mmHg Patient Position (if appropriate): Sitting Oxygen Therapy SpO2: 97 % O2 Device: Not Delivered Pain: Pain Assessment Pain Assessment: No/denies pain  See FIM for current functional status  Therapy/Group: Individual Therapy  [name] 11/23/2014, 3:04 PM

## 2014-11-23 NOTE — Progress Notes (Signed)
Speech Language Pathology Daily Session Note  Patient Details  Name: Brenda Blanchard MRN: [id number] Date of Birth: 1934/06/04  Today's Date: 11/23/2014 SLP Individual Time: 1100-1200 SLP Individual Time Calculation (min): 60 min  Short Term Goals: Week 1: SLP Short Term Goal 1 (Week 1): Pt will improve semi-complex word finding to improve functional communication in conversations over 80% of observable opportunities wtih supervision  SLP Short Term Goal 2 (Week 1): Pt will improve semi-complex functional problem solving with supervision over 80% of observable opportunities.  SLP Short Term Goal 3 (Week 1): Pt will improve recall of daily information via compensatory aids for 80% accuracy with supervision.   Skilled Therapeutic Interventions: Skilled treatment session focused on addressing cognition goals.  SLP provided education regarding effective memory compensatory strategies as well as difference between word retrieval difficulty and memory deficits.   SLP introduced a new learning task with an external aids to assist with recall of the rules as well as extra time and Min question cues to refer to external aid for recall throughout the task.   Continue with current plan of care.     FIM:  Comprehension Comprehension Mode: Auditory Comprehension: 6-Follows complex conversation/direction: With extra time/assistive device Expression Expression Mode: Verbal Expression: 5-Expresses basic needs/ideas: With no assist Social Interaction Social Interaction: 6-Interacts appropriately with others with medication or extra time (anti-anxiety, antidepressant). Problem Solving Problem Solving: 4-Solves basic 75 - 89% of the time/requires cueing 10 - 24% of the time Memory Memory: 4-Recognizes or recalls 75 - 89% of the time/requires cueing 10 - 24% of the time FIM - Eating Eating Activity: 7: Complete independence:no helper  Pain Pain Assessment Pain Assessment: No/denies pain  Therapy/Group:  Individual Therapy  Carmelia Roller., CCC-SLP [phone]  [place] 11/23/2014, 1:14 PM

## 2014-11-23 NOTE — Progress Notes (Signed)
Physical Therapy Session Note  Patient Details  Name: Brenda Blanchard MRN: [id number] Date of Birth: 11/20/1933  Today's Date: 11/23/2014 PT Individual Time: 0930-1030 (Co-tx with rec therapist) PT Individual Time Calculation (min): 60 min   Short Term Goals: Week 1:  PT Short Term Goal 1 (Week 1): Pt will perform supine <> sit with HOB flat using rail with min A, 25% cueing. PT Short Term Goal 2 (Week 1): Pt will transfer from bed <> w/c with min A and 25% cueing. PT Short Term Goal 3 (Week 1): Pt will perform functional ambulation x30' with Max A of single therapist. PT Short Term Goal 4 (Week 1): Pt will negotiate 3 stairs with 1 rail and Max A of single therapist. PT Short Term Goal 5 (Week 1): Pt will perform w/c mobility x75' with min A and 25% cueing.  Skilled Therapeutic Interventions/Progress Updates:    Co-treatment with recreational therapist focusing on decreasing compensatory movement patterns to promote normalized movement, increase independence with functional mobility, ambulation. See below for pre-treatment vital signs. Pt performed squat pivot transfers from w/c<>mat table with min A, max multimodal cueing for transfer setup, w/c parts management, and slow movement. With sit<>stand transfers from EOM, pt required mod A (lifting assist), tactile cueing at R knee for weight bearing. See below for detailed description of NMR.  Pt performed gait x50' in controlled environment with +2A for bilat HHA, (max A overall for stability), tactile cueing for lateral weight shifting, and verbal/tactile cueing for bilat hip extension and to address posterior rotation of trunk on R side. Following seated rest break, pt negotiated 5 stairs with bilat rails max A for stability, +2A for assist with RLE placement on stairs due to RLE inattention; tactlil cueing for lateral weight shift to L side, and verbal/tactile cueing for sequencing/technique, attention to RLE, and upright posture.  Discussed fall  precautions, ways to prevent future fall. Pt verbalized understanding of education but may require reinforcement. Recommending quick release belt while seated in w/c between therapies; however, pt refusing at this time. Pt was agreeable to keeping bilat w/c leg rests on while seated in w/c to increase stability/safety. Session ended in pt room, where pt was left seated in w/c with all needs within reach.  Therapy Documentation Precautions:  Precautions Precautions: Fall Restrictions Weight Bearing Restrictions: No Vital Signs: Therapy Vitals Pulse Rate: (!) 52 (taken manually) BP: (!) 148/59 mmHg Patient Position (if appropriate): Sitting (Resting) Pain: Pain Assessment Pain Assessment: No/denies pain Locomotion : Ambulation Ambulation/Gait Assistance: 1: +2 Total assist;2: Max assist;Other (comment)  NMR: Neuromuscular Facilitation: Right;Forced use;Lower Extremity;Activity to increase motor control;Activity to increase grading;Activity to increase coordination;Activity to increase anterior-posterior weight shifting;Activity to increase lateral weight shifting;Activity to increase sustained activation Seated EOM without LE support, R forearm propped on small wedge to promote R trunk elongation, pt performed multidirectional reaching with LUE (across midline, laterally, anterior). While still seated EOM without LE support, transitioned to reciprocal scooting (A/P, P/A, to R, then to L) to promote functional weight shifting, trunk dissociation. Finally, performed posterior then anterior scooting without LE support with manual resistance at PSIS then ASIS, respectively, to facilitate forward/backward R pelvic rotation.  See FIM for current functional status  Therapy/Group: Individual Therapy and Co-Treatment  Nylia Gavina, Malva Cogan 11/23/2014, 12:18 PM

## 2014-11-24 ENCOUNTER — Inpatient Hospital Stay (HOSPITAL_COMMUNITY): Payer: Medicare Other | Admitting: *Deleted

## 2014-11-24 ENCOUNTER — Inpatient Hospital Stay (HOSPITAL_COMMUNITY): Payer: Medicare Other | Admitting: Speech Pathology

## 2014-11-24 ENCOUNTER — Inpatient Hospital Stay (HOSPITAL_COMMUNITY): Payer: Medicare Other | Admitting: Occupational Therapy

## 2014-11-24 MED ORDER — CLONIDINE HCL 0.3 MG PO TABS
0.3000 mg | ORAL_TABLET | Freq: Three times a day (TID) | ORAL | Status: DC
  Administered 2014-11-24 – 2014-12-04 (×31): 0.3 mg via ORAL
  Filled 2014-11-24 (×34): qty 1

## 2014-11-24 NOTE — Progress Notes (Signed)
Patient refusing quick release belt. RN overheard Tech trying to place quick release belt for safety and patient refusing. RN attempted to educate patient on the need for belt as a reminder to call for help. Patient refused stating "Yall aren't going to do that to me. I promise I won't try to get up or pick up anything." Brenda Markoff K, RN

## 2014-11-24 NOTE — Progress Notes (Signed)
Recreational Therapy Session Note  Patient Details  Name: Brenda Blanchard MRN: [id number] Date of Birth: 14-Jun-1934 Today's Date: 11/24/2014  Pt with c/o of not feeling well-"tired."  Pt has had elevated BP today.  Session cancelled. [place] 11/24/2014, 10:44 AM

## 2014-11-24 NOTE — Progress Notes (Signed)
Social Work Patient ID: Brenda Blanchard, female   DOB: [birth year], 81 y.o.   MRN: [id number] Met with pt and spoke with sister via telephone to discuss team conference goals-supervision level and discharge 2/27.  She reports her granddaughter is coming to be home with her. She will contact her and inform her we need her to be her next Friday to go through therapies and prepare for discharge .  She will call this worker back to inform of when granddaughter will be here.

## 2014-11-24 NOTE — Progress Notes (Signed)
Patients blood pressure remains elevated. Complains of being more fatigued than usual today. Will continue to monitor. Jimmie Molly, RN

## 2014-11-24 NOTE — Progress Notes (Signed)
81 y.o. female with history of untreated HTN who was admitted on 11/11/14 with acute onset of right sided weakness, difficulty talking and right facial droop. SBP>200 at admission and patient treated with IV labetalol. CT head done revealing 20 x 22 mm area of hyperdense hemorrhage in the superior left thalamus, tracking into the posterior left corona radiata with intraventricular extension likely due to hypertensive bleed. Neurology recommended BP management with cardene drip and follow up CCT with stable bleed and no significant change in vasogenic edema. 2D echo with moderate LVH with EF 55-60% and trivial pericardial effusion. Patient with resultant right hemiparesis with mild ataxia, right inattention with visual deficits as well as transcortical aphasia. Has had urinary retention since foley d/c  Subjective/Complaints: No further falls DIscussed BP with nursing  Review of Systems - Negative except weak on R side Objective: Vital Signs: Blood pressure 218/90, pulse 59, temperature 98.7 F (37.1 C), temperature source Oral, resp. rate 17, height 5' 5"  (1.651 m), weight 86.7 kg (191 lb 2.2 oz), SpO2 100 %. No results found. Results for orders placed or performed during the hospital encounter of 11/16/14 (from the past 72 hour(s))  Glucose, capillary     Status: Abnormal   Collection Time: 11/21/14 10:00 AM  Result Value Ref Range   Glucose-Capillary 102 (H) 70 - 99 mg/dL  Glucose, capillary     Status: None   Collection Time: 11/22/14  6:51 AM  Result Value Ref Range   Glucose-Capillary 98 70 - 99 mg/dL  CBC with Differential/Platelet     Status: Abnormal   Collection Time: 11/22/14  9:44 AM  Result Value Ref Range   WBC 8.0 4.0 - 10.5 K/uL   RBC 4.29 3.87 - 5.11 MIL/uL   Hemoglobin 11.0 (L) 12.0 - 15.0 g/dL   HCT 34.3 (L) 36.0 - 46.0 %   MCV 80.0 78.0 - 100.0 fL   MCH 25.6 (L) 26.0 - 34.0 pg   MCHC 32.1 30.0 - 36.0 g/dL   RDW 14.7 11.5 - 15.5 %   Platelets 281 150 - 400 K/uL    Neutrophils Relative % 70 43 - 77 %   Neutro Abs 5.6 1.7 - 7.7 K/uL   Lymphocytes Relative 21 12 - 46 %   Lymphs Abs 1.7 0.7 - 4.0 K/uL   Monocytes Relative 8 3 - 12 %   Monocytes Absolute 0.6 0.1 - 1.0 K/uL   Eosinophils Relative 1 0 - 5 %   Eosinophils Absolute 0.1 0.0 - 0.7 K/uL   Basophils Relative 0 0 - 1 %   Basophils Absolute 0.0 0.0 - 0.1 K/uL  Creatinine, serum     Status: Abnormal   Collection Time: 11/23/14  7:42 AM  Result Value Ref Range   Creatinine, Ser 1.01 0.50 - 1.10 mg/dL   GFR calc non Af Amer 52 (L) >90 mL/min   GFR calc Af Amer 60 (L) >90 mL/min    Comment: (NOTE) The eGFR has been calculated using the CKD EPI equation. This calculation has not been validated in all clinical situations. eGFR's persistently <90 mL/min signify possible Chronic Kidney Disease.      HEENT: normal Cardio: RRR and no murmur Resp: CTA B/L and unlabored GI: BS positive and NT, ND Extremity:  Pulses positive and Edema mild Right hand, mild edema LLE>RLE, neg homan's, no consistent pain with ROM or palpation Skin:   Intact Neuro: Alert/Oriented, Cranial Nerve II-XII normal, Abnormal Sensory reduced R UE and RLE, Abnormal Motor 3-/  5 r Delt, bi, tri grip, HF, 4- R KE, 2- R ADF and Tone:  Within Normal Limits Cerebellar Mild dysmetria right finger nose finger Musc/Skel:  Normal Gen NAD   Assessment/Plan: 1. 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  which require 3+ hours per day of interdisciplinary therapy in a comprehensive inpatient rehab setting. Physiatrist is providing close team supervision and 24 hour management of active medical problems listed below. Physiatrist and rehab team continue to assess barriers to discharge/monitor patient progress toward functional and medical goals. FIM: FIM - Bathing Bathing Steps Patient Completed: Chest, Right Arm, Left Arm, Abdomen, Buttocks, Front perineal area, Right upper leg, Left upper leg Bathing: 4:  Steadying assist  FIM - Upper Body Dressing/Undressing Upper body dressing/undressing steps patient completed: Thread/unthread right bra strap, Thread/unthread left bra strap, Thread/unthread right sleeve of pullover shirt/dresss, Thread/unthread right sleeve of front closure shirt/dress, Put head through opening of pull over shirt/dress, Thread/unthread left sleeve of pullover shirt/dress, Thread/unthread left sleeve of front closure shirt/dress, Pull shirt around back of front closure shirt/dress, Pull shirt over trunk Upper body dressing/undressing: 4: Steadying assist FIM - Lower Body Dressing/Undressing Lower body dressing/undressing steps patient completed: Thread/unthread right underwear leg, Thread/unthread left underwear leg, Pull underwear up/down, Pull pants up/down, Thread/unthread left pants leg, Thread/unthread right pants leg Lower body dressing/undressing: 4: Steadying Assist  FIM - Toileting Toileting steps completed by patient: Adjust clothing prior to toileting, Performs perineal hygiene, Adjust clothing after toileting Toileting Assistive Devices: Grab bar or rail for support Toileting: 4: Steadying assist  FIM - Radio producer Devices: Grab bars Toilet Transfers: 4-To toilet/BSC: Min A (steadying Pt. > 75%)  FIM - Bed/Chair Transfer Bed/Chair Transfer Assistive Devices: Arm rests Bed/Chair Transfer: 4: Sit > Supine: Min A (steadying pt. > 75%/lift 1 leg)  FIM - Locomotion: Wheelchair Distance: 65 Locomotion: Wheelchair: 1: Total Assistance/staff pushes wheelchair (Pt<25%) FIM - Locomotion: Ambulation Locomotion: Ambulation Assistive Devices: Other (comment) (bilat HHA) Ambulation/Gait Assistance: 1: +2 Total assist, 2: Max assist, Other (comment) Locomotion: Ambulation: 1: Two helpers  Comprehension Comprehension Mode: Auditory Comprehension: 6-Follows complex conversation/direction: With extra time/assistive  device  Expression Expression Mode: Verbal Expression: 5-Expresses basic 90% of the time/requires cueing < 10% of the time.  Social Interaction Social Interaction: 6-Interacts appropriately with others with medication or extra time (anti-anxiety, antidepressant).  Problem Solving Problem Solving: 4-Solves basic 75 - 89% of the time/requires cueing 10 - 24% of the time  Memory Memory: 4-Recognizes or recalls 75 - 89% of the time/requires cueing 10 - 24% of the time  Medical Problem List and Plan: 1. 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  2. DVT Prophylaxis/Anticoagulation: Pharmaceutical: Lovenox,plt 281K 3. Pain Management: prob sensory dyseasthesias RLE 4. Mood: LCSW to follow for evaluation and support.  5. Neuropsych: This patient is capable of making decisions on her own behalf. 6. Skin/Wound Care: Routine pressure relief measures. Maintain adequate intake.  7. Fluids/Electrolytes/Nutrition: Document I/O. Push po fluids as poor output reported. Will check lytes in am.  8. HTN: Non-compliance? --On atenolol daily with clonidine .110m TID. will resume this dose 9. Wheezing: patient noted to have SOB as well as bradycardia. Will decrease atenolol to 50 mg daily. Likely due to allergies. Will add Rhinocort to help with allergic rhinitis.  10. Urinary retention: Push po fluids. Check UA/UCs. Monitor PVRs and cath prn volumes > 350 cc or no void in 8 hours.  11.  Left foot  edema start compression hose, exam is otherwise neg  LOS (Days) 8 A FACE TO FACE EVALUATION WAS PERFORMED  Wyatte Dames E 11/24/2014, 7:32 AM

## 2014-11-24 NOTE — Progress Notes (Signed)
Physical Therapy Session Note  Patient Details  Name: [name] MRN: [id number] Date of Birth: Aug 09, 1934  Today's Date: 11/24/2014 PT Individual Time: 0840-0930 PT Individual Time Calculation (min): 50 min   Short Term Goals: Week 1:  PT Short Term Goal 1 (Week 1): Pt will perform supine <> sit with HOB flat using rail with min A, 25% cueing. PT Short Term Goal 2 (Week 1): Pt will transfer from bed <> w/c with min A and 25% cueing. PT Short Term Goal 3 (Week 1): Pt will perform functional ambulation x30' with Max A of single therapist. PT Short Term Goal 4 (Week 1): Pt will negotiate 3 stairs with 1 rail and Max A of single therapist. PT Short Term Goal 5 (Week 1): Pt will perform w/c mobility x75' with min A and 25% cueing.  Skilled Therapeutic Interventions/Progress Updates:    Pt received seated in w/c reporting having a "bad day so far," per pt secondary to significant fatigue, elevated BP. Per chart, BP (within When asked if pt felt well enough to participate, pt stated, "Not really, but I'll do whatever you ask me to do." Noted increased shaking (difficult to discern if tremor, sensory ataxia) in RUE. MD aware of elevated BP this morning. Spoke with RN, who had recently taken manual BP with reading of 188/72. Pt reporting feeling generally unwell but denying headache, visual, or sensory changes. Pt expressing need to urinate. Therefore, transported pt to bathroom in w/c, where pt performed stand pivot transfer form w/c<>toilet with grab bars and min A, mod verbal cueing for safety awareness, setup, and weight shifting (focus on lateral weight shift to L side during pivoting).   Per pt request to get dressed, pt performed stand pivot transfer from w/c<>bed with min A with effective return demonstration of cueing as described above but verbal reinforcement needed for setup and weight shifting. Seated EOB, pt performed UB/LB dressing with close supervision to min guard for stability/balance,  mod cueing to increase pt awareness of decreased postural control when reaching downward. Pt pulled up pants via sit>stand from EOB with min A, tactile cueing at R knee for increase proprioception. BP monitored throughout session. Pt reporting feeling "better" post-session. Departed with pt seated in w.c with all needs within reach, pt in no apparent distress. Pt continuing to refuse quick release belt for safety. Therefore, placed Dycem under R foot to maintain positioning on w/c leg rest, increase stability in seated.  Therapy Documentation Precautions:  Precautions Precautions: Fall Restrictions Weight Bearing Restrictions: No Vital Signs: Therapy Vitals BP: (!) 188/72 mmHg  Patient Position (if appropriate): Sitting Pain: Pain Assessment Pain Assessment: No/denies pain  See FIM for current functional status  Therapy/Group: Individual Therapy  Stefano Gaul 11/24/2014, 12:36 PM

## 2014-11-24 NOTE — Progress Notes (Signed)
Social Work [name], LCSW Social Worker Signed  Patient Care Conference 11/24/2014  1:59 PM    Expand All Collapse All   Inpatient RehabilitationTeam Conference and Plan of Care Update Date: 11/24/2014   Time: 11;15 AM     Patient Name: Brenda Blanchard       Medical Record Number: [id number]  Date of Birth: July 28, 1934 Sex: Female         Room/Bed: 4M02C/4M02C-01 Payor Info: Payor: MEDICARE / Plan: MEDICARE PART A AND B / Product Type: *No Product type* /    Admitting Diagnosis: L ICH   Admit Date/Time:  11/16/2014  5:42 PM Admission Comments: No comment available   Primary Diagnosis:  ICH (intracerebral hemorrhage) Principal Problem: ICH (intracerebral hemorrhage)    Patient Active Problem List     Diagnosis  Date Noted   .  Numbness of face     .  Wheezing without diagnosis of asthma     .  Right hemiparesis  11/17/2014   .  ICH (intracerebral hemorrhage)  11/16/2014   .  Essential hypertension  11/16/2014   .  Intracerebral hemorrhage of other cerebral location     .  Stroke due to intracerebral hemorrhage  11/11/2014     Expected Discharge Date: Expected Discharge Date: 12/04/14  Team Members Present: Physician leading conference: Dr. Alysia Penna Social Worker Present: Ovidio Kin, LCSW Nurse Present: Heather Roberts, RN PT Present: Georjean Mode, PT;Blair Hobble, PT OT Present: [name], OT SLP Present: Gunnar Fusi, SLP;Nicole Page, SLP PPS Coordinator present : Daiva Nakayama, RN, CRRN        Current Status/Progress  Goal  Weekly Team Focus   Medical     poor safety awareness  maximize NM recovery  safety awareness increase   Bowel/Bladder     PVRs have been low consistenly, voiding appropiately. LBM 2/16, loose  Continent of bowel and bladder  Remain cont of bowel and bladder   Swallow/Nutrition/ Hydration       na         ADL's     min assist stand pivot transfers, min assist bathing and dressing.  Pt demonstrates Rt lean in sitting and increasingly in  standing requiring tactile cues to promote midline awareness   mod I self-care tasks, supervision higher level IADLs   transfers, sit <> stand, standing balance, RUE NMR, home making tasks   Mobility     Min A bed mobility, Min to Mod A basic transfers, Mod to +2A gait and stairs  Mod I for transfers and w/c mobility/management; supervision ambulation; min A stairs  R NMR, postural/gait stability, stair negotiation, continue hands-on family training   Communication     Supervision   Mod I  carryover of word finding strategies     Safety/Cognition/ Behavioral Observations    Supervision-Min assist for recall and complex problem solving    Supervision   increase recall and emergent awarenss of deficits    Pain     No complaints of pain  pain less than 2  Assess pain qshift and PRN   Skin     No skin breakdown, minimal bruises to abdomen due to SQ lovenox   No new skin breakdown while on inpatinet rehab   Assess skin q shift and PRN       *See Care Plan and progress notes for long and short-term goals.    Barriers to Discharge:  poor safety     Possible Resolutions to Barriers:   ?  trasfer to [place] to be close to      Discharge Planning/Teaching Needs:   Son planning to come at discharge to stay 1-2 weeks, deciding whether pt going back to [place] with him.  Sister local but can not assist        Team Discussion:    Balance issues and higher level memory issues-will need supervision level for safety at home. Refuses quick release belt. Had facial numbness rechecked CT unchanged.  Need family prior to discharge from rehab   Revisions to Treatment Plan:    None    Continued Need for Acute Rehabilitation Level of Care: The patient requires daily medical management by a physician with specialized training in physical medicine and rehabilitation for the following conditions: Daily direction of a multidisciplinary physical rehabilitation program to ensure safe treatment while eliciting the highest  outcome that is of practical value to the patient.: Yes Daily medical management of patient stability for increased activity during participation in an intensive rehabilitation regime.: Yes Daily analysis of laboratory values and/or radiology reports with any subsequent need for medication adjustment of medical intervention for : Neurological problems;Other  [name] 11/24/2014, 1:59 PM                 [name], LCSW Social Worker Signed  Patient Care Conference 11/17/2014  3:28 PM    Expand All Collapse All   Inpatient RehabilitationTeam Conference and Plan of Care Update Date: 11/17/2014   Time: 11;20 AM     Patient Name: Brenda Blanchard       Medical Record Number: [id number]  Date of Birth: 05-16-1934 Sex: Female         Room/Bed: 4M02C/4M02C-01 Payor Info: Payor: MEDICARE / Plan: MEDICARE PART A AND B / Product Type: *No Product type* /    Admitting Diagnosis: L ICH   Admit Date/Time:  11/16/2014  5:42 PM Admission Comments: No comment available   Primary Diagnosis:  ICH (intracerebral hemorrhage) Principal Problem: ICH (intracerebral hemorrhage)    Patient Active Problem List     Diagnosis  Date Noted   .  Right hemiparesis  11/17/2014   .  ICH (intracerebral hemorrhage)  11/16/2014   .  Essential hypertension  11/16/2014   .  Intracerebral hemorrhage of other cerebral location     .  Stroke due to intracerebral hemorrhage  11/11/2014     Expected Discharge Date: Expected Discharge Date: 12/04/14  Team Members Present: Physician leading conference: Dr. Alysia Penna Social Worker Present: Ovidio Kin, LCSW Nurse Present: Elliot Cousin, RN PT Present: Raylene Everts, PT;Caroline Lacinda Axon, PT;Blair Hobble, PT OT Present: [name], OT SLP Present: Windell Moulding, SLP PPS Coordinator present : Daiva Nakayama, RN, CRRN        Current Status/Progress  Goal  Weekly Team Focus   Medical     mild aphasia, mild R HP  Home D/C  initiate therapy program    Bowel/Bladder     Patient retaining urine and requiring in and out caths; continent of bowel; LBM 2/7  Continent of bowel and bladder  Assist patient to [facility] or toilet for all toileting to encourage bladder emptying   Swallow/Nutrition/ Hydration       na         ADL's     mod assist stand pivot transfers, unable to assess dressing due to no clothes on eval, mod assist bathing  mod I self-care tasks, supervision higher level IADLs   transfers, sit <> stand, standing  balance, RUE NMR, home making tasks   Mobility     Mod A bed mobility and transfers, +2A gait and stairs  Mod I for transfers and w/c mobility/management; supervision ambulation; min A stairs  R NMR, bed mobility, gait/transfer training, stair negotiation, initiate pt/family education   Communication     mild higher level word finding deficits in conversation   Mod I  education and carryover of compensatory strategies.    Safety/Cognition/ Behavioral Observations    mild higher level deficits for memory and functional problem solving   supervision   education and carryover of compensatory strategies    Pain     Denies  </=3  Assess pain qshift and PRN   Skin     No skin isssues noted  No new skin breakdown  Assess skin qshift and PRN; turn q2hr in bed      *See Care Plan and progress notes for long and short-term goals.    Barriers to Discharge:  has a home in [place] and [place]      Possible Resolutions to Barriers:   Will set up      Discharge Planning/Teaching Needs:     Home with son who will be here for 1-2 weeks, otherwise sister can check in on her.  Needs to be mod/i level by discharge      Team Discussion:    New evaluations-goals mod/i wheelchair level, supervision ambulation. Retaining fluid-I & O cath will see if starts to void.  Sensation and perception affected by stroke.    Revisions to Treatment Plan:    New eval    Continued Need for Acute Rehabilitation Level of Care: The patient requires  daily medical management by a physician with specialized training in physical medicine and rehabilitation for the following conditions: Daily direction of a multidisciplinary physical rehabilitation program to ensure safe treatment while eliciting the highest outcome that is of practical value to the patient.: Yes Daily medical management of patient stability for increased activity during participation in an intensive rehabilitation regime.: Yes Daily analysis of laboratory values and/or radiology reports with any subsequent need for medication adjustment of medical intervention for : Neurological problems  [name] 11/18/2014, 8:33 AM                  Patient ID: Brenda Blanchard, female   DOB: [birth year], 81 y.o.   MRN: [id number]

## 2014-11-24 NOTE — Progress Notes (Addendum)
Speech Language Pathology Weekly Progress Note  Patient Details  Name: Brenda Blanchard MRN: [id number] Date of Birth: 08/04/1934  Beginning of progress report period: November 18, 2014 End of progress report period: November 24, 2014  Today's Date: 11/24/2014  Short Term Goals: Week 1: SLP Short Term Goal 1 (Week 1): Pt will improve semi-complex word finding to improve functional communication in conversations over 80% of observable opportunities wtih supervision  SLP Short Term Goal 1 - Progress (Week 1): Partly met SLP Short Term Goal 2 (Week 1): Pt will improve semi-complex functional problem solving with supervision over 80% of observable opportunities.  SLP Short Term Goal 2 - Progress (Week 1): Partly met SLP Short Term Goal 3 (Week 1): Pt will improve recall of daily information via compensatory aids for 80% accuracy with supervision.  SLP Short Term Goal 3 - Progress (Week 1): Partly met    New Short Term Goals: Week 2: SLP Short Term Goal 1 (Week 2): Pt will improve carryover use of word finding strategies in conversations in 90% of observable opportunities wtih supervision verbal cues. SLP Short Term Goal 2 (Week 2): Pt will improve complex functional problem solving with supervision level question cues to self-monitor and correct errors over 90% of observable opportunities.  SLP Short Term Goal 3 (Week 2): Pt will improve recall of daily information via use of compensatory aids, self-initiated in 90% of observable opportunities with supervision level question cues.   Weekly Progress Updates: Patient has made functional gains and has partially met 3 out of 3 short term goals this reporting period due to improved cognitive goals.  Currently, patient continues to require Min assist for recall, use of word fining strategies and complex problem solving.  Patient and family education is ongoing.  Patient would benefit from continued skilled SLP intervention in order to maximize her  functional independence prior to discharge with 24/7 Supervision.   Intensity: Minumum of 1-2 x/day, 30 to 90 minutes Frequency: 3 to 5 out of 7 days Duration/Length of Stay: 11/30/14 Treatment/Interventions: Cognitive remediation/compensation;Functional tasks;Cueing hierarchy;Internal/external aids;Multimodal communication approach;Speech/Language facilitation;Patient/family education  Carmelia Roller., CCC-SLP [phone]  [place] 11/24/2014, 3:46 PM

## 2014-11-24 NOTE — Patient Care Conference (Signed)
Inpatient RehabilitationTeam Conference and Plan of Care Update Date: 11/24/2014   Time: 11;15 AM    Patient Name: Brenda Blanchard      Medical Record Number: [id number]  Date of Birth: July 04, 1934 Sex: Female         Room/Bed: 4M02C/4M02C-01 Payor Info: Payor: MEDICARE / Plan: MEDICARE PART A AND B / Product Type: *No Product type* /    Admitting Diagnosis: L ICH  Admit Date/Time:  11/16/2014  5:42 PM Admission Comments: No comment available   Primary Diagnosis:  ICH (intracerebral hemorrhage) Principal Problem: ICH (intracerebral hemorrhage)  Patient Active Problem List   Diagnosis Date Noted  . Numbness of face   . Wheezing without diagnosis of asthma   . Right hemiparesis 11/17/2014  . ICH (intracerebral hemorrhage) 11/16/2014  . Essential hypertension 11/16/2014  . Intracerebral hemorrhage of other cerebral location   . Stroke due to intracerebral hemorrhage 11/11/2014    Expected Discharge Date: Expected Discharge Date: 12/04/14  Team Members Present: Physician leading conference: Dr. Alysia Penna Social Worker Present: Ovidio Kin, LCSW Nurse Present: Heather Roberts, RN PT Present: Georjean Mode, PT;Blair Hobble, PT OT Present: [name], OT SLP Present: Gunnar Fusi, SLP;Nicole Page, SLP PPS Coordinator present : Daiva Nakayama, RN, CRRN     Current Status/Progress Goal Weekly Team Focus  Medical   poor safety awareness  maximize NM recovery  safety awareness increase   Bowel/Bladder   PVRs have been low consistenly, voiding appropiately. LBM 2/16, loose  Continent of bowel and bladder  Remain cont of bowel and bladder   Swallow/Nutrition/ Hydration     na        ADL's   min assist stand pivot transfers, min assist bathing and dressing.  Pt demonstrates Rt lean in sitting and increasingly in standing requiring tactile cues to promote midline awareness  mod I self-care tasks, supervision higher level IADLs  transfers, sit <> stand, standing balance, RUE NMR, home  making tasks   Mobility   Min A bed mobility, Min to Mod A basic transfers, Mod to +2A gait and stairs  Mod I for transfers and w/c mobility/management; supervision ambulation; min A stairs  R NMR, postural/gait stability, stair negotiation, continue hands-on family training   Communication   Supervision   Mod I  carryover of word finding strategies    Safety/Cognition/ Behavioral Observations  Supervision-Min assist for recall and complex problem solving   Supervision   increase recall and emergent awarenss of deficits   Pain   No complaints of pain  pain less than 2  Assess pain qshift and PRN   Skin   No skin breakdown, minimal bruises to abdomen due to SQ lovenox  No new skin breakdown while on inpatinet rehab  Assess skin q shift and PRN       *See Care Plan and progress notes for long and short-term goals.  Barriers to Discharge: poor safety    Possible Resolutions to Barriers:  ? trasfer to [place] to be close to     Discharge Planning/Teaching Needs:  Son planning to come at discharge to stay 1-2 weeks, deciding whether pt going back to [place] with him.  Sister local but can not assist      Team Discussion:  Balance issues and higher level memory issues-will need supervision level for safety at home. Refuses quick release belt. Had facial numbness rechecked CT unchanged.  Need family prior to discharge from rehab  Revisions to Treatment Plan:  None   Continued Need  for Acute Rehabilitation Level of Care: The patient requires daily medical management by a physician with specialized training in physical medicine and rehabilitation for the following conditions: Daily direction of a multidisciplinary physical rehabilitation program to ensure safe treatment while eliciting the highest outcome that is of practical value to the patient.: Yes Daily medical management of patient stability for increased activity during participation in an intensive rehabilitation regime.: Yes Daily analysis  of laboratory values and/or radiology reports with any subsequent need for medication adjustment of medical intervention for : Neurological problems;Other  Daleon Willinger, Gardiner Rhyme 11/24/2014, 1:59 PM

## 2014-11-24 NOTE — Progress Notes (Signed)
Speech Language Pathology Daily Session Note  Patient Details  Name: Brenda Blanchard MRN: [id number] Date of Birth: 10-13-1933  Today's Date: 11/24/2014 SLP Individual Time: 1400-1500 SLP Individual Time Calculation (min): 60 min  Short Term Goals: Week 1: SLP Short Term Goal 1 (Week 1): Pt will improve semi-complex word finding to improve functional communication in conversations over 80% of observable opportunities wtih supervision  SLP Short Term Goal 1 - Progress (Week 1): Partly met SLP Short Term Goal 2 (Week 1): Pt will improve semi-complex functional problem solving with supervision over 80% of observable opportunities.  SLP Short Term Goal 2 - Progress (Week 1): Partly met SLP Short Term Goal 3 (Week 1): Pt will improve recall of daily information via compensatory aids for 80% accuracy with supervision.  SLP Short Term Goal 3 - Progress (Week 1): Partly met  Skilled Therapeutic Interventions: Skilled treatment session focused on memory, problem-solving, and attention goals.  SLP facilitated session by providing min-mod A for short-term memory task.  SLP provided extra time, phonemic/semantic cues, multiple repetitions, and sentence completion. With extra time and multiple repetitions throughout the session to recall 2 details re: medication questions for RN staff, pt recalled both details by the end of the session.  Pt had overt difficulty recalling which pill went into the correct pill bottle when sorting by color; even with multiple repetitions of placing pills in the correct pill bottle, she continued to dump a few pills out to check if she was placing the correct pill in the right pill bottle.  SLP facilitated session by providing min A for problem solving task.  SLP provided extra time and when need, 2 answer choices re: medication management during a mock trial.  Pt moderate independence sustaining attention during medication management task for entire session; pt became distracted  infrequently with external distractions, but independently redirected herself to the medication management task.  Continue with current plan of care   FIM:  Comprehension Comprehension Mode: Auditory Comprehension: 6-Follows complex conversation/direction: With extra time/assistive device Expression Expression Mode: Verbal Expression: 5-Expresses complex 90% of the time/cues < 10% of the time Social Interaction Social Interaction: 6-Interacts appropriately with others with medication or extra time (anti-anxiety, antidepressant). Problem Solving Problem Solving: 5-Solves basic 90% of the time/requires cueing < 10% of the time Memory Memory: 4-Recognizes or recalls 75 - 89% of the time/requires cueing 10 - 24% of the time  Pain Pain Assessment Pain Assessment: No/denies pain  Therapy/Group: Individual Therapy Annabell Howells, MA CCC-SLP Annabell Howells A 11/24/2014, 5:02 PM

## 2014-11-24 NOTE — Progress Notes (Addendum)
Recreational Therapy Session Note  Patient Details  Name: Brenda Blanchard MRN: [id number] Date of Birth: 05/24/1934 Today's Date: 11/24/2014 Late Entry from 11/23/14 Pain: no c/o  Skilled Therapeutic Interventions/Progress Updates: Session focused on activity tolerance, dynamic standing balance, safety awareness, RUE use & ambulation.  Pt stood reaching outside BOS with LUE with mod -max assist & max verbal cues for safety.  Pt ambulated with +2 HHA ~50 feet.   Therapy/Group: Co-Treatment Kvion Shapley 11/24/2014, 8:07 AM

## 2014-11-24 NOTE — Progress Notes (Signed)
Occupational Therapy Weekly Progress Note  Patient Details  Name: Brenda Blanchard MRN: [id number] Date of Birth: [birth year]  Beginning of progress report period: November 17, 2014 End of progress report period: November 24, 2014  Today's Date: 11/24/2014 OT Individual Time: 0700-0720 OT Individual Time Calculation (min): 20 min    Patient has met 2 of 4 short term goals and partly met 1 of 4 short term goals.  Pt is at a min assist overall level with squat pivot transfers and self-care tasks of bathing and dressing.  Have not focused on transfers at ambulatory level as pt fairly impulsive and disorganized during mobility, recommend pt be w/c level if only having intermittent supervision to increase safety.  Pt's BP continues to be high, requiring modifications to treatment to ensure safety and education regarding signs and symptoms.    Patient continues to demonstrate the following deficits: Rt inattention, impaired coordination, Rt lean in sitting and standing, decreased balance strategies, impaired standing balance and therefore will continue to benefit from skilled OT intervention to enhance overall performance with BADL, iADL and Reduce care partner burden.  Patient progressing toward long term goals..  Continue plan of care.  OT Short Term Goals Week 1:  OT Short Term Goal 1 (Week 1): Pt will complete toilet transfer with mod assist ambulating with RW OT Short Term Goal 1 - Progress (Week 1): Partly met OT Short Term Goal 2 (Week 1): Pt will complete UB dressing with min assist OT Short Term Goal 2 - Progress (Week 1): Met OT Short Term Goal 3 (Week 1): Pt will complete LB dressing with mod assist OT Short Term Goal 3 - Progress (Week 1): Met OT Short Term Goal 4 (Week 1): Pt will complete 2 grooming tasks in standing with min assist for standing balance and use of RUE as dominant OT Short Term Goal 4 - Progress (Week 1): Not met Week 2:  OT Short Term Goal 1 (Week 2): Pt will complete  toilet transfers with supervision at w/c level OT Short Term Goal 2 (Week 2): Pt will complete shower transfer with min assist from w/c level OT Short Term Goal 3 (Week 2): Pt will complete bathing with supervision OT Short Term Goal 4 (Week 2): Pt will complete LB dressing with supervision OT Short Term Goal 5 (Week 2): Pt will complete 2 grooming tasks in standing with min assist for standing balance and use of RUE as dominant  Skilled Therapeutic Interventions/Progress Updates:    Attempted to engage in scheduled OT bathing and dressing session.  Pt received in bed reporting tired but wanting to take shower.  Vitals assessed at EOB 218/90 with pt reporting tired and slight tremors in unsupported sitting, pt continuing to report feeling fine.  Notified RN.  Completed squat pivot transfer to w/c and propelled pt to sink to wash face.  Spoke with RN who reports elevated BP throughout night, informed RN of pt appearing more fatigued and tremor like movements in sitting.  Pt set up for breakfast, placed head in hands.  Session ended at this time due to elevated BP and unsafe to participate in scheduled session.  Pt missed 40 mins skilled OT due to elevated BP and awaiting further direction from MD/PA.  Therapy Documentation Precautions:  Precautions Precautions: Fall Restrictions Weight Bearing Restrictions: No General: General OT Amount of Missed Time: 40 Minutes Vital Signs: Therapy Vitals Temp: 98.7 F (37.1 C) Temp Source: Oral Pulse Rate: (!) 59 Resp: 17 BP: (!) 218/90 mmHg  Patient Position (if appropriate): Sitting Oxygen Therapy SpO2: 100 % O2 Device: Not Delivered Pain:  Pt with no c/o pain  See FIM for current functional status  Therapy/Group: Individual Therapy  [name] 11/24/2014, 7:50 AM

## 2014-11-25 ENCOUNTER — Inpatient Hospital Stay (HOSPITAL_COMMUNITY): Payer: Medicare Other | Admitting: Physical Therapy

## 2014-11-25 ENCOUNTER — Inpatient Hospital Stay (HOSPITAL_COMMUNITY): Payer: Medicare Other | Admitting: Speech Pathology

## 2014-11-25 ENCOUNTER — Inpatient Hospital Stay (HOSPITAL_COMMUNITY): Payer: Medicare Other | Admitting: Occupational Therapy

## 2014-11-25 NOTE — Progress Notes (Signed)
Patient continues to show poor safety awareness at times. RN again educated patient on the importance of calling for assistance and encouraged quick release belt while up in wheelchair. Patient continues to refuse. Patient blood pressure remains elevated continuing to monitor at least Q4 hours. Patient verbalizes no complaints at this time. Jimmie Molly, RN

## 2014-11-25 NOTE — Progress Notes (Signed)
Physical Therapy Session Note  Patient Details  Name: Brenda Blanchard MRN: [id number] Date of Birth: 07/03/1934  Today's Date: 11/25/2014 PT Individual Time: 1455-1555 PT Individual Time Calculation (min): 60 min Makeup Session  Skilled Therapeutic Interventions/Progress Updates:   Pt received sitting in wheelchair, agreeable to makeup session. Session focused on R neuro re-ed, trunk activation, and postural control. Pt propelled wheelchair using BUE to therapy gym with multiple rest breaks and min A overall, max verbal/tactile cues for attending to RUE for increased excursion on w/c rim. Pt performed stand pivot transfers this session with min A overall. Seated edge of mat with BLE unsupported, patient performed multidirectional reaching with RUE for objects to facilitate R trunk shortening while propped on wedge under L elbow. Pt with excellent tolerance to activity and noted improvement in R trunk activation with repetition. Transitioned to working on postural control and equal WB through BLE with patient straddling kay bench to facilitate upright trunk and anterior pelvic tilt while participating in game of multidirectional reaching slightly outside BOS and across midline/tossing bean bags to target using RUE. Gait training following neuro re-education with L HHA x 40 ft requiring initial min assist faded to mod assist with fatigue, mod verbal cues for upright trunk/hip extension. Pt negotiated up/down six 5" steps using 2 rails with close supervision, min A to assist with turning at top and bottom of stairs. Pt required min cues for step-to pattern and upright trunk/hip extension. Pt performed NuStep using BUE/BLE at level 4 x 3.5 min for R NMR and coordination. Pt returned to room and left sitting in wheelchair with RN present and needs within reach. Pt declined tall kneeling during session due to hx of knee pain and declined quick release belt for safety at end of session.   Therapy  Documentation Precautions:  Precautions Precautions: Fall Restrictions Weight Bearing Restrictions: No Vital Signs: Therapy Vitals BP: (!) 180/67 mmHg Patient Position (if appropriate): Sitting Pain: Pain Assessment Pain Assessment: No/denies pain  See FIM for current functional status  Therapy/Group: Individual Therapy  Laretta Alstrom 11/25/2014, 4:01 PM

## 2014-11-25 NOTE — Progress Notes (Signed)
Physical Therapy Weekly Progress Note  Patient Details  Name: Brenda Blanchard MRN: [id number] Date of Birth: [birth year]  Beginning of progress report period: November 17, 2014 End of progress report period: November 25, 2014  Today's Date: 11/25/2014 PT Individual Time: 0254-2706 (Co-treatment with rec therapist) PT Individual Time Calculation (min): 60 min   Patient has met 5 of 5 short term goals.  Since PT evaluation, pt has demonstrated marked improvements in stability/independence with functional mobility, ambulation. Pt currently requires supervision for bed mobility, mod A for functional transfers, and max A for ambulation. Cueing/facilitation during sessions is currently focused on safety awareness, RLE activation/attention, and grading of movement.   Patient continues to demonstrate the following deficits:R hemiplegia, R inattention, decreased postural control and therefore will continue to benefit from skilled PT intervention to enhance overall performance with activity tolerance, balance, postural control, ability to compensate for deficits, functional use of  right upper extremity and right lower extremity, awareness and coordination.  Patient progressing toward majority of long term goals.  Continue plan of care.  PT Short Term Goals Week 1:  PT Short Term Goal 1 (Week 1): Pt will perform supine <> sit with HOB flat using rail with min A, 25% cueing. PT Short Term Goal 1 - Progress (Week 1): Met PT Short Term Goal 2 (Week 1): Pt will transfer from bed <> w/c with min A and 25% cueing. PT Short Term Goal 2 - Progress (Week 1): Met PT Short Term Goal 3 (Week 1): Pt will perform functional ambulation x30' with Max A of single therapist. PT Short Term Goal 3 - Progress (Week 1): Met PT Short Term Goal 4 (Week 1): Pt will negotiate 3 stairs with 1 rail and Max A of single therapist. PT Short Term Goal 4 - Progress (Week 1): Met PT Short Term Goal 5 (Week 1): Pt will perform w/c mobility  x75' with min A and 25% cueing. PT Short Term Goal 5 - Progress (Week 1): Met Week 2:  PT Short Term Goal 1 (Week 2): STG's = LTG's secondary to anticipated LOS.  Skilled Therapeutic Interventions/Progress Updates:    Co-treatment with rec therapist focusing on promoting midline posture, symmetrical weight bearing/muscle activation with seated, standing, and transitional movements. Pt received seated in w/c; agreeable to therapy. Pt performed w/c mobility x35' in controlled environment with L hemi technique; due to increased pt difficulty coordinating w/c propulsion using hemi technique, transitioned to using bilat LE's for final 20'. W/c mobility trial ended per pt request due to increased fatigue. Transported pt remaining distance to gym, where pt performed stand pviot transfers from w/c<>mat table with mod A (lifting/lowering assist) but progressing to Min A with tactile/verbal cueing for full anterior weight shift, controlled descent during stand > sit.    In seated, pt demonstrates increased weightbearing on R buttocks, causing passive elongation of trunk on R side. Also noted decreased RLE weightbearing during sit<>stand transfers. Noted pt continuing to avoid use of RLE during transitional movements; therefore, NMR interventions (see below) focused on active trunk shortening on R side as well as promoting RLE weightbearing/activation during sit<>stand transfers.   Pt also performed multiple short-distance (>15') ambulation trials then gait x35' in controlled environment with max A of single therapist with multimodal cueing for lateral weight shifting to R side and upright posture (bialt hip extension). Will plan on attempting tall kneeling in future sessions to increase proximal stability, facilitate more normalized gait pattern. Session ended in pt room, where pt was left  seated in w/c with all needs within reach.  Therapy Documentation Precautions:  Precautions Precautions:  Fall Restrictions Weight Bearing Restrictions: No Vital Signs: Therapy Vitals Temp: 98 F (36.7 C) Temp Source: Oral Pulse Rate: 60 Resp: 17 BP: (!) 190/86 mmHg Patient Position (if appropriate): Lying Oxygen Therapy SpO2: 98 % O2 Device: Not Delivered Pain: Pain Assessment Pain Assessment: No/denies pain Locomotion : Ambulation Ambulation/Gait Assistance: 3: Mod assist Wheelchair Mobility Distance: 40  NMR: Neuromuscular Facilitation: Right;Forced use;Lower Extremity;Activity to increase motor control;Activity to increase grading;Activity to increase coordination;Activity to increase anterior-posterior weight shifting;Activity to increase lateral weight shifting;Activity to increase sustained activation;Upper Extremity Seated, RUE anterolateral reaching to promote RLE weightbearing, full anterior weight shift during transitional movement from sit >stand.  Placed 2" lift under L foot to promote lateral weight shift to R side, increase RLE weight bearing, activation, then performed blocked practice of sit<>stand transfers without UE support with multimodal cueing focused on full anterior weight shift, erect trunk flexion, and slow, controlled movement for increased grading, motor control.  Seated unsupported with small wedge under R buttocks (to increase symmetry of weight bearing, address passive elongation of R trunk in seated), pt performed multidirectional reaching with bilat UE's for functional weight shifting, then tossing horse shoe to increase stability with balance perturbations. Transitioned to seated EOM with R forearm propped on wedge (for R trunk elongation) while performing multidirectional reaching with LUE.   See FIM for current functional status  Therapy/Group: Individual Therapy  Brenda Blanchard, Brenda Blanchard 11/25/2014, 4:52 PM

## 2014-11-25 NOTE — Progress Notes (Signed)
Speech Language Pathology Daily Session Note  Patient Details  Name: Brenda Blanchard MRN: [id number] Date of Birth: 1934-08-17  Today's Date: 11/25/2014 SLP Individual Time: 1030-1130 SLP Individual Time Calculation (min): 60 min  Short Term Goals: Week 2: SLP Short Term Goal 1 (Week 2): Pt will improve carryover use of word finding strategies in conversations in 90% of observable opportunities wtih supervision verbal cues. SLP Short Term Goal 2 (Week 2): Pt will improve complex functional problem solving with supervision level question cues to self-monitor and correct errors over 90% of observable opportunities.  SLP Short Term Goal 3 (Week 2): Pt will improve recall of daily information via use of compensatory aids, self-initiated in 90% of observable opportunities with supervision level question cues.   Skilled Therapeutic Interventions: Skilled treatment session focused on addressing cognition goals.  SLP facilitated session with Min question cues to self-monitor and check errors during completion of a complex money management task; patient with awareness of errors following task and reported that that is why her son assisted her with finances prior to admission. SLP also facilitated session with a new learning task that focused on utilizing association as a memory compensatory strategy.  Patient educated on rationale for association and require Mod faded to Min verbal cues to utilize throughout completion of task.  Patient again more aware of difficulty following task and appeared worried/anxiuos regarding difficulty with remembering.  SLP reinforced use of strategies as well as our recommendation for 24/7 assist to relieve burden on patient.  Continue with current plan of care.     FIM:  Comprehension Comprehension Mode: Auditory Comprehension: 6-Follows complex conversation/direction: With extra time/assistive device Expression Expression Mode: Verbal Expression: 5-Expresses complex 90% of  the time/cues < 10% of the time Social Interaction Social Interaction: 6-Interacts appropriately with others with medication or extra time (anti-anxiety, antidepressant). Problem Solving Problem Solving: 5-Solves basic 90% of the time/requires cueing < 10% of the time Memory Memory: 4-Recognizes or recalls 75 - 89% of the time/requires cueing 10 - 24% of the time  Pain Pain Assessment Pain Assessment: No/denies pain  Therapy/Group: Individual Therapy  Carmelia Roller., [place] [phone]  [place] 11/25/2014, 1:31 PM

## 2014-11-25 NOTE — Progress Notes (Signed)
Occupational Therapy Session Note  Patient Details  Name: Brenda Blanchard MRN: [id number] Date of Birth: 1934-01-30  Today's Date: 11/25/2014 OT Individual Time: 1300-1400 OT Individual Time Calculation (min): 60 min    Short Term Goals: Week 2:  OT Short Term Goal 1 (Week 2): Pt will complete toilet transfers with supervision at w/c level OT Short Term Goal 2 (Week 2): Pt will complete shower transfer with min assist from w/c level OT Short Term Goal 3 (Week 2): Pt will complete bathing with supervision OT Short Term Goal 4 (Week 2): Pt will complete LB dressing with supervision OT Short Term Goal 5 (Week 2): Pt will complete 2 grooming tasks in standing with min assist for standing balance and use of RUE as dominant  Skilled Therapeutic Interventions/Progress Updates:  Upon entering the room, pt seated in wheelchair awaiting therapist with clothing picked out by sister. Sister remains present during session but just to observe. Pt with no c/o pain this session. Pt propelled self into bathroom and locked wheelchair breaks without cues. Toilet transfer with min A for balance and toileting performed with steady assist during clothing management. Pt removing all clothing on toilet before transferring back to to wheelchair with Min A. Pt then transferred squat pivot wheelchair <> shower chair with steady assist and use of grab bar. Pt required encouragment to use R UE during functional tasks. Min A balance when standing in shower to wash buttocks and peri area. Pt performing dressing tasks seated in wheelchair at sink side. Again, pt require steady assist for LB clothing management. Pt remained seated in wheelchair with sister present and call bell within reach upon exiting the room.   Therapy Documentation Precautions:  Precautions Precautions: Fall Restrictions Weight Bearing Restrictions: No Pain: Pain Assessment Pain Assessment: No/denies pain  See FIM for current functional  status  Therapy/Group: Individual Therapy  Phineas Semen 11/25/2014, 2:13 PM

## 2014-11-25 NOTE — Progress Notes (Signed)
Social Work Patient ID: Brenda Blanchard, female   DOB: 02-07-1934, 81 y.o.   MRN: [id number] Met with pt and her sister who reports her son and granddaughter are flying in 2/25 and can be here 2/26 at 10;00 for education prior to pt's discharge on 2/27. Pt will have 24 hr care upon discharge, sister unsure how long son will stay here, discussion about pt returning back to [place].

## 2014-11-25 NOTE — Progress Notes (Signed)
Recreational Therapy Session Note  Patient Details  Name: Deisha Stull MRN: [id number] Date of Birth: 03/13/1934 Today's Date: 11/25/2014  Pain: no c/o Skilled Therapeutic Interventions/Progress Updates: Session focused on activity tolerance, sitting at midline, trunk elongation during reaching & RUE use.  Pt sat EOM reaching outside BOS with close supervision-min assist for balance.  Small wedge placed under right hip to promote sitting at midline with pt leaning onto wedge with RUE to promote trunk elongation during reaching activity.  Transitioned to the same set up on the opposite side with small wedge under left hip, reaching with LUE.  Therapy/Group: Co-Treatment  Jeffrey Voth 11/25/2014, 11:01 AM

## 2014-11-25 NOTE — Progress Notes (Signed)
81 y.o. female with history of untreated HTN who was admitted on 11/11/14 with acute onset of right sided weakness, difficulty talking and right facial droop. SBP>200 at admission and patient treated with IV labetalol. CT head done revealing 20 x 22 mm area of hyperdense hemorrhage in the superior left thalamus, tracking into the posterior left corona radiata with intraventricular extension likely due to hypertensive bleed. Neurology recommended BP management with cardene drip and follow up CCT with stable bleed and no significant change in vasogenic edema. 2D echo with moderate LVH with EF 55-60% and trivial pericardial effusion. Patient with resultant right hemiparesis with mild ataxia, right inattention with visual deficits as well as transcortical aphasia. Has had urinary retention since foley d/c  Subjective/Complaints: Discussed d/c date with pt, pt states"hopefully I'll be ready"   Review of Systems - Negative except weak on R side Objective: Vital Signs: Blood pressure 128/98, pulse 60, temperature 98 F (36.7 C), temperature source Oral, resp. rate 17, height 5' 5"  (1.651 m), weight 86.7 kg (191 lb 2.2 oz), SpO2 98 %. No results found. Results for orders placed or performed during the hospital encounter of 11/16/14 (from the past 72 hour(s))  Glucose, capillary     Status: None   Collection Time: 11/22/14  6:51 AM  Result Value Ref Range   Glucose-Capillary 98 70 - 99 mg/dL  CBC with Differential/Platelet     Status: Abnormal   Collection Time: 11/22/14  9:44 AM  Result Value Ref Range   WBC 8.0 4.0 - 10.5 K/uL   RBC 4.29 3.87 - 5.11 MIL/uL   Hemoglobin 11.0 (L) 12.0 - 15.0 g/dL   HCT 34.3 (L) 36.0 - 46.0 %   MCV 80.0 78.0 - 100.0 fL   MCH 25.6 (L) 26.0 - 34.0 pg   MCHC 32.1 30.0 - 36.0 g/dL   RDW 14.7 11.5 - 15.5 %   Platelets 281 150 - 400 K/uL   Neutrophils Relative % 70 43 - 77 %   Neutro Abs 5.6 1.7 - 7.7 K/uL   Lymphocytes Relative 21 12 - 46 %   Lymphs Abs 1.7 0.7 -  4.0 K/uL   Monocytes Relative 8 3 - 12 %   Monocytes Absolute 0.6 0.1 - 1.0 K/uL   Eosinophils Relative 1 0 - 5 %   Eosinophils Absolute 0.1 0.0 - 0.7 K/uL   Basophils Relative 0 0 - 1 %   Basophils Absolute 0.0 0.0 - 0.1 K/uL  Creatinine, serum     Status: Abnormal   Collection Time: 11/23/14  7:42 AM  Result Value Ref Range   Creatinine, Ser 1.01 0.50 - 1.10 mg/dL   GFR calc non Af Amer 52 (L) >90 mL/min   GFR calc Af Amer 60 (L) >90 mL/min    Comment: (NOTE) The eGFR has been calculated using the CKD EPI equation. This calculation has not been validated in all clinical situations. eGFR's persistently <90 mL/min signify possible Chronic Kidney Disease.      HEENT: normal Cardio: RRR and no murmur Resp: CTA B/L and unlabored GI: BS positive and NT, ND Extremity:  Pulses positive and Edema mild Right hand, mild edema LLE>RLE, neg homan's, no consistent pain with ROM or palpation Skin:   Intact Neuro: Alert/Oriented, Cranial Nerve II-XII normal, Abnormal Sensory reduced R UE and RLE, Abnormal Motor 4-/ 5 r Delt, bi, tri grip, HF, 4- R KE, 3- R ADF and Tone:  Within Normal Limits Cerebellar Mild dysmetria right finger nose finger  Musc/Skel:  Normal Gen NAD   Assessment/Plan: 1. 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  which require 3+ hours per day of interdisciplinary therapy in a comprehensive inpatient rehab setting. Physiatrist is providing close team supervision and 24 hour management of active medical problems listed below. Physiatrist and rehab team continue to assess barriers to discharge/monitor patient progress toward functional and medical goals. Strength is improving , pt appears flat, not very hopeful, will ask Neuropsych to eval for post CVA depression   FIM: FIM - Bathing Bathing Steps Patient Completed: Chest, Right Arm, Left Arm, Abdomen, Buttocks, Front perineal area, Right upper leg, Left upper leg Bathing: 4: Steadying  assist  FIM - Upper Body Dressing/Undressing Upper body dressing/undressing steps patient completed: Thread/unthread right bra strap, Thread/unthread left bra strap, Thread/unthread right sleeve of pullover shirt/dresss, Thread/unthread right sleeve of front closure shirt/dress, Put head through opening of pull over shirt/dress, Thread/unthread left sleeve of pullover shirt/dress, Thread/unthread left sleeve of front closure shirt/dress, Pull shirt around back of front closure shirt/dress, Pull shirt over trunk Upper body dressing/undressing: 4: Steadying assist FIM - Lower Body Dressing/Undressing Lower body dressing/undressing steps patient completed: Thread/unthread right underwear leg, Thread/unthread left underwear leg, Pull underwear up/down, Pull pants up/down, Thread/unthread left pants leg, Thread/unthread right pants leg Lower body dressing/undressing: 4: Steadying Assist  FIM - Toileting Toileting steps completed by patient: Adjust clothing prior to toileting, Performs perineal hygiene, Adjust clothing after toileting Toileting Assistive Devices: Grab bar or rail for support Toileting: 4: Steadying assist  FIM - Radio producer Devices: Grab bars Toilet Transfers: 4-To toilet/BSC: Min A (steadying Pt. > 75%), 4-From toilet/BSC: Min A (steadying Pt. > 75%)  FIM - Bed/Chair Transfer Bed/Chair Transfer Assistive Devices: Arm rests, Bed rails Bed/Chair Transfer: 4: Bed > Chair or W/C: Min A (steadying Pt. > 75%), 4: Chair or W/C > Bed: Min A (steadying Pt. > 75%)  FIM - Locomotion: Wheelchair Distance: 65 Locomotion: Wheelchair: 0: Activity did not occur FIM - Locomotion: Ambulation Locomotion: Ambulation Assistive Devices: Other (comment) (bilat HHA) Ambulation/Gait Assistance: 1: +2 Total assist, 2: Max assist, Other (comment) Locomotion: Ambulation: 0: Activity did not occur  Comprehension Comprehension Mode: Auditory Comprehension: 6-Follows  complex conversation/direction: With extra time/assistive device  Expression Expression Mode: Verbal Expression: 5-Expresses complex 90% of the time/cues < 10% of the time  Social Interaction Social Interaction: 6-Interacts appropriately with others with medication or extra time (anti-anxiety, antidepressant).  Problem Solving Problem Solving: 5-Solves basic 90% of the time/requires cueing < 10% of the time  Memory Memory: 4-Recognizes or recalls 75 - 89% of the time/requires cueing 10 - 24% of the time  Medical Problem List and Plan: 1. 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  2. DVT Prophylaxis/Anticoagulation: Pharmaceutical: Lovenox,plt 281K 3. Pain Management: prob sensory dyseasthesias RLE 4. Mood: LCSW to follow for evaluation and support.  5. Neuropsych: This patient is capable of making decisions on her own behalf. 6. Skin/Wound Care: Routine pressure relief measures. Maintain adequate intake.  7. Fluids/Electrolytes/Nutrition: Document I/O. Push po fluids as poor output reported. Will check lytes in am.  8. HTN: Non-compliance? --On atenolol daily with clonidine .85m TID. will resume this dose 9. Wheezing: patient noted to have SOB as well as bradycardia. Will decrease atenolol to 50 mg daily. Likely due to allergies. Will add Rhinocort to help with allergic rhinitis.  10. Urinary retention: Push po fluids. Check UA/UCs. Monitor PVRs and cath prn volumes >  350 cc or no void in 8 hours.  11.  Left foot edema start compression hose, exam is otherwise neg  LOS (Days) 9 A FACE TO FACE EVALUATION WAS PERFORMED  KIRSTEINS,ANDREW E 11/25/2014, 6:44 AM

## 2014-11-26 ENCOUNTER — Inpatient Hospital Stay (HOSPITAL_COMMUNITY): Payer: Medicare Other | Admitting: *Deleted

## 2014-11-26 ENCOUNTER — Inpatient Hospital Stay (HOSPITAL_COMMUNITY): Payer: Medicare Other | Admitting: Occupational Therapy

## 2014-11-26 ENCOUNTER — Inpatient Hospital Stay (HOSPITAL_COMMUNITY): Payer: Medicare Other | Admitting: Physical Therapy

## 2014-11-26 ENCOUNTER — Inpatient Hospital Stay (HOSPITAL_COMMUNITY): Payer: Medicare Other | Admitting: Speech Pathology

## 2014-11-26 MED ORDER — AMLODIPINE BESYLATE 5 MG PO TABS
5.0000 mg | ORAL_TABLET | Freq: Every day | ORAL | Status: DC
  Administered 2014-11-26 – 2014-11-29 (×4): 5 mg via ORAL
  Filled 2014-11-26 (×6): qty 1

## 2014-11-26 NOTE — Progress Notes (Signed)
Speech Language Pathology Daily Session Note  Patient Details  Name: Brenda Blanchard MRN: [id number] Date of Birth: June 16, 1934  Today's Date: 11/26/2014 SLP Individual Time: 1300-1400 SLP Individual Time Calculation (min): 60 min  Short Term Goals: Week 1: SLP Short Term Goal 1 (Week 1): Pt will improve semi-complex word finding to improve functional communication in conversations over 80% of observable opportunities wtih supervision  SLP Short Term Goal 1 - Progress (Week 1): Partly met SLP Short Term Goal 2 (Week 1): Pt will improve semi-complex functional problem solving with supervision over 80% of observable opportunities.  SLP Short Term Goal 2 - Progress (Week 1): Partly met SLP Short Term Goal 3 (Week 1): Pt will improve recall of daily information via compensatory aids for 80% accuracy with supervision.  SLP Short Term Goal 3 - Progress (Week 1): Partly met  Skilled Therapeutic Interventions: Skilled treatment session focused on addressing cognition goals.  SLP facilitated session with Mod faded question cues to recall task and procedures from yesterday's session.  Once there was a visual reminder SLP was able to fade question cues to [place] assist level.  SLP also facilitated session with Min verbal cues to utilize association as a memory compensatory strategy throughout completion of task.  Patient reported feeling more confident in her abilities today; SLP emphasized that repetition was also an effective strategy to help with recall and carryover of new information.  Continue with current plan of care.       FIM:  Comprehension Comprehension Mode: Auditory Comprehension: 6-Follows complex conversation/direction: With extra time/assistive device Expression Expression Mode: Verbal Expression: 5-Expresses complex 90% of the time/cues < 10% of the time Social Interaction Social Interaction: 6-Interacts appropriately with others with medication or extra time (anti-anxiety,  antidepressant). Problem Solving Problem Solving: 5-Solves basic problems: With no assist Memory Memory: 4-Recognizes or recalls 75 - 89% of the time/requires cueing 10 - 24% of the time FIM - Eating Eating Activity: 7: Complete independence:no helper  Pain Pain Assessment Pain Assessment: No/denies pain  Therapy/Group: Individual Therapy  Carmelia Roller., CCC-SLP [phone]  [place] 11/26/2014, 3:43 PM

## 2014-11-26 NOTE — Progress Notes (Signed)
Subjective/Complaints: Discussed d/c date with pt, pt states"hopefully I'll be ready"   Review of Systems - Negative except weak on R side Objective: Vital Signs: Blood pressure 193/69, pulse 52, temperature 98.4 F (36.9 C), temperature source Oral, resp. rate 18, height 5' 5"  (1.651 m), weight 86.7 kg (191 lb 2.2 oz), SpO2 98 %. No results found. Results for orders placed or performed during the hospital encounter of 11/16/14 (from the past 72 hour(s))  Creatinine, serum     Status: Abnormal   Collection Time: 11/23/14  7:42 AM  Result Value Ref Range   Creatinine, Ser 1.01 0.50 - 1.10 mg/dL   GFR calc non Af Amer 52 (L) >90 mL/min   GFR calc Af Amer 60 (L) >90 mL/min    Comment: (NOTE) The eGFR has been calculated using the CKD EPI equation. This calculation has not been validated in all clinical situations. eGFR's persistently <90 mL/min signify possible Chronic Kidney Disease.      HEENT: normal Cardio: RRR and no murmur Resp: CTA B/L and unlabored GI: BS positive and NT, ND Extremity:  Pulses positive and Edema mild Right hand, mild edema LLE>RLE, neg homan's, no consistent pain with ROM or palpation Skin:   Intact Neuro: Alert/Oriented, Cranial Nerve II-XII normal, Abnormal Sensory reduced R UE and RLE, Abnormal Motor 4-/ 5 r Delt, bi, tri grip, HF, 4- R KE, 3- R ADF and Tone:  Within Normal Limits Cerebellar Mild dysmetria right finger nose finger Musc/Skel:  Normal Gen NAD   Assessment/Plan: 1. 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  which require 3+ hours per day of interdisciplinary therapy in a comprehensive inpatient rehab setting. Physiatrist is providing close team supervision and 24 hour management of active medical problems listed below. Physiatrist and rehab team continue to assess barriers to discharge/monitor patient progress toward functional and medical goals.  FIM: FIM - Bathing Bathing Steps Patient Completed:  Chest, Right Arm, Left Arm, Abdomen, Front perineal area, Buttocks, Left upper leg, Right upper leg, Right lower leg (including foot), Left lower leg (including foot) Bathing: 4: Steadying assist  FIM - Upper Body Dressing/Undressing Upper body dressing/undressing steps patient completed: Thread/unthread right bra strap, Thread/unthread left bra strap, Thread/unthread right sleeve of pullover shirt/dresss, Thread/unthread right sleeve of front closure shirt/dress, Put head through opening of pull over shirt/dress, Thread/unthread left sleeve of pullover shirt/dress, Thread/unthread left sleeve of front closure shirt/dress, Pull shirt around back of front closure shirt/dress, Pull shirt over trunk Upper body dressing/undressing: 5: Set-up assist to: Obtain clothing/put away FIM - Lower Body Dressing/Undressing Lower body dressing/undressing steps patient completed: Thread/unthread right underwear leg, Thread/unthread left underwear leg, Pull underwear up/down, Pull pants up/down, Thread/unthread left pants leg, Thread/unthread right pants leg, Don/Doff left sock Lower body dressing/undressing: 4: Min-Patient completed 75 plus % of tasks  FIM - Toileting Toileting steps completed by patient: Adjust clothing prior to toileting, Performs perineal hygiene, Adjust clothing after toileting Toileting Assistive Devices: Grab bar or rail for support Toileting: 4: Steadying assist  FIM - Radio producer Devices: Grab bars Toilet Transfers: 4-To toilet/BSC: Min A (steadying Pt. > 75%), 4-From toilet/BSC: Min A (steadying Pt. > 75%)  FIM - Bed/Chair Transfer Bed/Chair Transfer Assistive Devices: Arm rests, Bed rails Bed/Chair Transfer: 4: Bed > Chair or W/C: Min A (steadying Pt. > 75%), 4: Chair or W/C > Bed: Min A (steadying Pt. > 75%), 3: Bed > Chair or W/C: Mod A (lift or lower  assist), 3: Chair or W/C > Bed: Mod A (lift or lower assist)  FIM - Locomotion:  Wheelchair Distance: 40 Locomotion: Wheelchair: 1: Travels less than 50 ft with moderate assistance (Pt: 50 - 74%) FIM - Locomotion: Ambulation Locomotion: Ambulation Assistive Devices: Other (comment) (none) Ambulation/Gait Assistance: 3: Mod assist Locomotion: Ambulation: 1: Travels less than 50 ft with moderate assistance (Pt: 50 - 74%)  Comprehension Comprehension Mode: Auditory Comprehension: 6-Follows complex conversation/direction: With extra time/assistive device  Expression Expression Mode: Verbal Expression: 5-Expresses complex 90% of the time/cues < 10% of the time  Social Interaction Social Interaction: 6-Interacts appropriately with others with medication or extra time (anti-anxiety, antidepressant).  Problem Solving Problem Solving: 5-Solves basic 90% of the time/requires cueing < 10% of the time  Memory Memory: 4-Recognizes or recalls 75 - 89% of the time/requires cueing 10 - 24% of the time  Medical Problem List and Plan: 1. 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  2. DVT Prophylaxis/Anticoagulation: Pharmaceutical: Lovenox,plt 281K 3. Pain Management: prob sensory dyseasthesias RLE 4. Mood: LCSW to follow for evaluation and support.  5. Neuropsych: This patient is capable of making decisions on her own behalf. 6. Skin/Wound Care: Routine pressure relief measures. Maintain adequate intake.  7. Fluids/Electrolytes/Nutrition: Document I/O. Push po fluids as poor output reported. Will check lytes in am.  8. HTN: Non-compliance? --On atenolol daily with clonidine .18m TID. Add norvasc 9. Wheezing: patient noted to have SOB as well as bradycardia. Will decrease atenolol to 50 mg daily. Likely due to allergies. Will add Rhinocort to help with allergic rhinitis.  10. Urinary retention: Push po fluids. Check UA/UCs. Monitor PVRs and cath prn volumes > 350 cc or no void in 8 hours.    LOS (Days) 10 A FACE TO FACE EVALUATION WAS  PERFORMED  KIRSTEINS,ANDREW E 11/26/2014, 6:45 AM

## 2014-11-26 NOTE — Progress Notes (Signed)
Physical Therapy Session Note  Patient Details  Name: Brenda Blanchard MRN: [id number] Date of Birth: 04/04/1934  Today's Date: 11/26/2014 PT Individual Time: 0830-0930 PT Individual Time Calculation (min): 60 min   Short Term Goals: Week 2:  PT Short Term Goal 1 (Week 2): STG's = LTG's secondary to anticipated LOS.  Skilled Therapeutic Interventions/Progress Updates:    Pt received seated in w/c; agreeable to therapy. Session focused on stair negotiation and use of tall kneeling to facilitate proximal stability, increase pt independence with standing/gait. Transported pt to/from gym in w/c with total A for energy conservation. In treatment gym, provided +2A to transition into tall kneeling with bilat UE support on kaye bench x7.5 minutes total with min A for stability, multimodal cueing for bilat hip extension. Performed short bouts (<10 seconds) of tall kneeling without UE support with max encouragement, mod A for stability. Activity ended per pt request secondary to increased discomfort in R knee.   During seated rest break, discussed current home setup (2 steps to enter home without rails). Pt planning to have rail installed at said stairs prior to D/C home. Pt reporting that rail will need ot be installed on L side (ascending), as front door swings outwards and would block a rail on R side. Pt negotiated 3 stairs x2 consecutive trials with step-to pattern, ascending with max A and descending with mod A. During initial trial, pt ascended forward-facing with L rail and max multimodal cueing for technique, sequencing, lateral weight shifting, and safety awareness. During subsequent trial, pt ascended laterally with bilat UE support at L rail, tactile cueing at R knee for proprioceptive input, and multimodal cueing for technique, lateral weight shifting. Pt descended laterally during both trials with mod A for stability and verbal cueing for technique (focus on leaving room on step for both feet). Will  continue to practice and plan on following up on decision for rail installation prior to D/C.  Pt with increased difficulty performing R knee flexion throughout this session. Seated EOM, assessment of R knee revealed excessive lateral tracking of R patella. Gentle R patellar glides (medial/inferior) were effective in increasing pain-free R knee flexion. Pt reporting no R knee pain post-session. RN made aware. Session ended in pt room, where pt was left seated in w/c with all needs within reach.  Therapy Documentation Precautions:  Precautions Precautions: Fall Restrictions Weight Bearing Restrictions: No Pain:  Pt with increased R knee pain (4/10 on FACES scale) during tall kneeling activity. Pain diminished completely with positional change, gentle R patellar glides, and rest. RN made aware. NMR: Focused on transitional movements for postural awareness, grading of movement, and anterior weight shifting. Performed multiple trials of sit<>stand transfers without UE support with wedge under buttocks to increase anterior pelvic tilt in seated. Cueing focused on slow, controlled performance of select ranges of transfer to increase active control of trunk and R knee.  See FIM for current functional status  Therapy/Group: Individual Therapy  Hobble, Malva Cogan 11/26/2014, 1:17 PM

## 2014-11-26 NOTE — Progress Notes (Signed)
Occupational Therapy Session Note  Patient Details  Name: Brenda Blanchard MRN: [id number] Date of Birth: 1934/07/06  Today's Date: 11/26/2014 OT Individual Time: 0712-1975 OT Individual Time Calculation (min): 60 min    Short Term Goals: Week 2:  OT Short Term Goal 1 (Week 2): Pt will complete toilet transfers with supervision at w/c level OT Short Term Goal 2 (Week 2): Pt will complete shower transfer with min assist from w/c level OT Short Term Goal 3 (Week 2): Pt will complete bathing with supervision OT Short Term Goal 4 (Week 2): Pt will complete LB dressing with supervision OT Short Term Goal 5 (Week 2): Pt will complete 2 grooming tasks in standing with min assist for standing balance and use of RUE as dominant  Skilled Therapeutic Interventions/Progress Updates:    Engaged in ADL retraining with focus on functional transfers, increased safety awareness and motor control with transitional movements, and functional use of RUE during self-care tasks.  Pt seated at EOB upon arrival, reporting need to toilet.  Stand pivot transfer bed > w/c > toilet min assist with increased motor control during transfers.  Pt completed toileting and doffing clothes seated on toilet.  Stand pivot toilet > w/c > shower chair with min/steady assist with use of grab bars.  Pt independently locking w/c brakes prior to all transfers and with improved awareness of RLE prior to sit > stand in shower.  Steady assist when washing perineal area and buttocks in standing in shower and with dressing at sit > stand level at sink.  Educated on increased use of RUE with grooming tasks and with self-feeding.    Therapy Documentation Precautions:  Precautions Precautions: Fall Restrictions Weight Bearing Restrictions: No General:   Vital Signs: Therapy Vitals Temp: 98.4 F (36.9 C) Temp Source: Oral Pulse Rate: (!) 52 Resp: 18 BP: (!) 186/60 mmHg Patient Position (if appropriate): Lying Oxygen Therapy SpO2: 98  % O2 Device: Not Delivered Pain:  Pt with no c/o pain  See FIM for current functional status  Therapy/Group: Individual Therapy  [name] 11/26/2014, 9:28 AM

## 2014-11-26 NOTE — Progress Notes (Addendum)
Occupational Therapy Session Note  Patient Details  Name: Brenda Blanchard MRN: [id number] Date of Birth: 27-Oct-1933  Today's Date: 11/26/2014 OT Individual Time: 1500-1600 OT Individual Time Calculation (min): 60 min  Make up session  Short Term Goals: Week 1:  OT Short Term Goal 1 (Week 1): Pt will complete toilet transfer with mod assist ambulating with RW OT Short Term Goal 1 - Progress (Week 1): Partly met OT Short Term Goal 2 (Week 1): Pt will complete UB dressing with min assist OT Short Term Goal 2 - Progress (Week 1): Met OT Short Term Goal 3 (Week 1): Pt will complete LB dressing with mod assist OT Short Term Goal 3 - Progress (Week 1): Met OT Short Term Goal 4 (Week 1): Pt will complete 2 grooming tasks in standing with min assist for standing balance and use of RUE as dominant OT Short Term Goal 4 - Progress (Week 1): Not met Week 2:  OT Short Term Goal 1 (Week 2): Pt will complete toilet transfers with supervision at w/c level OT Short Term Goal 2 (Week 2): Pt will complete shower transfer with min assist from w/c level OT Short Term Goal 3 (Week 2): Pt will complete bathing with supervision OT Short Term Goal 4 (Week 2): Pt will complete LB dressing with supervision OT Short Term Goal 5 (Week 2): Pt will complete 2 grooming tasks in standing with min assist for standing balance and use of RUE as dominant  Skilled Therapeutic Interventions/Progress Updates:    Focus of treatment was static and dynamicstanding balance in functional task of household duties in kitchen, and laundry.  Utilized RUE throug out session .  Pt propelled wc out of room with mod assist.  OT assisted to laundry room.  Pt. Stood and removed clothes out of dryer with body braced against washer for balance.  Utilized RUE to manipulate clothes with minimal cues for usage.   Went to ADL kitchen:  Pt stood and used counter for balance while reaching for can out of overhead cabinet and pot in lower cabinet.  Pt needed  stabilization to hips and trunk for balance.  Pt. ambulated using counter to simulate getting water to add to pot.  She washed bowl with BUE and minimal cues for force use of RUE. Practiced standing and getting item out of refrigerator and passing across to cabinet and back to refrigerator.   Educated pt on regular breathing and not holding breath during functional activities.  Bed making.  Pt stood and made bed with minimal assist with 1 LOB which she was able to catch using the bed as stabilizer.   Returned to room via wc and max assist with propelling wc.  Left pt in wc with  call bell,phone within reach.    Therapy Documentation Precautions:  Precautions Precautions: Fall Restrictions Weight Bearing Restrictions: No      Pain:  none    See FIM for current functional status  Therapy/Group: Individual Therapy  Lisa Roca 11/26/2014, 12:25 PM

## 2014-11-27 ENCOUNTER — Inpatient Hospital Stay (HOSPITAL_COMMUNITY): Payer: Medicare Other | Admitting: Physical Therapy

## 2014-11-27 DIAGNOSIS — I619 Nontraumatic intracerebral hemorrhage, unspecified: Secondary | ICD-10-CM

## 2014-11-27 NOTE — Progress Notes (Signed)
Subjective/Complaints: Overall feeling well. Concerned about elevated bp's   Review of Systems - Negative except weak on R side Objective: Vital Signs: Blood pressure 175/67, pulse 52, temperature 98.3 F (36.8 C), temperature source Oral, resp. rate 18, height 5\' 5"  (1.651 m), weight 86.7 kg (191 lb 2.2 oz), SpO2 100 %. No results found. No results found for this or any previous visit (from the past 72 hour(s)).   HEENT: normal Cardio: RRR and no murmur Resp: CTA B/L and unlabored GI: BS positive and NT, ND Extremity:  Pulses positive and Edema mild Right hand, mild edema LLE>RLE, neg homan's, no consistent pain with ROM or palpation Skin:   Intact Neuro: Alert/Oriented, Cranial Nerve II-XII normal, Abnormal Sensory reduced R UE and RLE, Abnormal Motor 3+ to 4-/ 5 r Delt, bi, tri grip, HF, 4- R KE, 3- R ADF and Tone:  Within Normal Limits, good sitting balance Cerebellar Mild dysmetria right finger nose finger Musc/Skel:  Normal Gen NAD   Assessment/Plan: 1. 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  which require 3+ hours per day of interdisciplinary therapy in a comprehensive inpatient rehab setting. Physiatrist is providing close team supervision and 24 hour management of active medical problems listed below. Physiatrist and rehab team continue to assess barriers to discharge/monitor patient progress toward functional and medical goals.  FIM: FIM - Bathing Bathing Steps Patient Completed: Chest, Right Arm, Left Arm, Abdomen, Front perineal area, Buttocks, Left upper leg, Right upper leg, Right lower leg (including foot), Left lower leg (including foot) Bathing: 4: Steadying assist  FIM - Upper Body Dressing/Undressing Upper body dressing/undressing steps patient completed: Thread/unthread right bra strap, Thread/unthread left bra strap, Thread/unthread right sleeve of pullover shirt/dresss, Put head through opening of pull over shirt/dress,  Thread/unthread left sleeve of pullover shirt/dress, Pull shirt over trunk, Hook/unhook bra Upper body dressing/undressing: 5: Set-up assist to: Obtain clothing/put away FIM - Lower Body Dressing/Undressing Lower body dressing/undressing steps patient completed: Thread/unthread right underwear leg, Thread/unthread left underwear leg, Pull underwear up/down, Pull pants up/down, Thread/unthread left pants leg, Thread/unthread right pants leg, Don/Doff right shoe, Don/Doff left shoe, Fasten/unfasten right shoe, Fasten/unfasten left shoe Lower body dressing/undressing: 4: Steadying Assist  FIM - Toileting Toileting steps completed by patient: Adjust clothing prior to toileting, Performs perineal hygiene, Adjust clothing after toileting Toileting Assistive Devices: Grab bar or rail for support Toileting: 4: Steadying assist  FIM - Radio producer Devices: Grab bars Toilet Transfers: 4-To toilet/BSC: Min A (steadying Pt. > 75%), 4-From toilet/BSC: Min A (steadying Pt. > 75%)  FIM - Bed/Chair Transfer Bed/Chair Transfer Assistive Devices: Arm rests, Bed rails Bed/Chair Transfer: 4: Bed > Chair or W/C: Min A (steadying Pt. > 75%), 4: Chair or W/C > Bed: Min A (steadying Pt. > 75%)  FIM - Locomotion: Wheelchair Distance: 40 Locomotion: Wheelchair: 1: Total Assistance/staff pushes wheelchair (Pt<25%) FIM - Locomotion: Ambulation Locomotion: Ambulation Assistive Devices: Other (comment) (none) Ambulation/Gait Assistance: 3: Mod assist Locomotion: Ambulation: 0: Activity did not occur  Comprehension Comprehension Mode: Auditory Comprehension: 6-Follows complex conversation/direction: With extra time/assistive device  Expression Expression Mode: Verbal Expression: 5-Expresses complex 90% of the time/cues < 10% of the time  Social Interaction Social Interaction: 6-Interacts appropriately with others with medication or extra time (anti-anxiety,  antidepressant).  Problem Solving Problem Solving: 5-Solves basic problems: With no assist  Memory Memory: 4-Recognizes or recalls 75 - 89% of the time/requires cueing 10 - 24% of the time  Medical  Problem List and Plan: 1. 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  2. DVT Prophylaxis/Anticoagulation: Pharmaceutical: Lovenox,plt 281K 3. Pain Management: prob sensory dyseasthesias RLE 4. Mood: LCSW to follow for evaluation and support.  5. Neuropsych: This patient is capable of making decisions on her own behalf. 6. Skin/Wound Care: Routine pressure relief measures. Maintain adequate intake.  7. Fluids/Electrolytes/Nutrition: Document I/O. Push po fluids as poor output reported. Will check lytes in am.  8. HTN: Non-compliance? --On atenolol daily with clonidine .3mg  TID. Added norvasc yesterday---slowly lower bp's 9. Wheezing: patient noted to have SOB as well as bradycardia.   atenolol decreased to 50 mg daily.  -rhinocort 10. Urinary retention: Push po fluids. Check UA/UCs. Monitor PVRs and cath prn volumes > 350 cc or no void in 8 hours.    LOS (Days) 11 A FACE TO FACE EVALUATION WAS PERFORMED  Mikiah Demond T 11/27/2014, 8:00 AM

## 2014-11-27 NOTE — Progress Notes (Signed)
Physical Therapy Session Note  Patient Details  Name: Brenda Blanchard MRN: [id number] Date of Birth: Jul 31, 1934  Today's Date: 11/27/2014 PT Individual Time: 1345-1441 PT Individual Time Calculation (min): 56 min   Short Term Goals: Week 2:  PT Short Term Goal 1 (Week 2): STG's = LTG's secondary to anticipated LOS.  Skilled Therapeutic Interventions/Progress Updates:   Pt received in w/c.  Pt denies pain.  Assisted pt with donning shoes and transported to gym in w/c.  Discussed goals and focus of therapy to assess memory, recall and awareness.  Pt could not verbalize that therapy has been focusing on increased use and attention to R side.   In gym performed stair negotiation training up/down 3 stairs (4") with UE support on L rail x 2 reps with pt requiring mod-max A overall for safety, balance, weight shifting and sequencing.  Pt required max verbal cues for safe step to sequencing, max A to flex R hip and knee to advance R foot to step initially but then was able to advance RLE more automatically but with decreased hip and knee flexion overall and max verbal cues for attention to R hand placement on rail as well as more upright posture.  Pt continues to present with impaired intellectual or emergent awareness of deficits and need for modifications for safe D/C home.  Performed NMR for R; see below.  At end of session performed gait x 15' with RW and mod A overall with verbal and tactile cues for upright posture, lateral weight shifting and full clearance/advancement of RLE prior to LLE advancement.  Performed transfer from EOB > w/c squat pivot to L with min A.  Pt set up in room with all items within reach.  Therapy Documentation Precautions:  Precautions Precautions: Fall Restrictions Weight Bearing Restrictions: No Vital Signs: Therapy Vitals Temp: 98.6 F (37 C) Temp Source: Oral Pulse Rate: (!) 52 BP: (!) 154/47 mmHg Patient Position (if appropriate): Sitting Oxygen Therapy SpO2: 100  % O2 Device: Not Delivered Pain: Pain Assessment Pain Assessment: No/denies pain Other Treatments: Treatments Neuromuscular Facilitation: Right;Upper Extremity;Lower Extremity;Forced use;Activity to increase coordination;Activity to increase motor control;Activity to increase timing and sequencing;Activity to increase grading;Activity to increase sustained activation;Activity to increase lateral weight shifting;Activity to increase anterior-posterior weight shifting in standing with bilat UE support on stair rails with focus on lateral weight shifting and grading of activation/initiation of R hip and knee flexion to advance RLE onto and off of step while maintaining trunk in midline; pt tends to advance RLE in full extension.  Also performed static standing with R then L foot up on step and releasing rails with trunk in midline to focus on maintaining lateral weight shift + trunk control.  In sitting performed isolated R heel slides with foot on pillow case on floor with focus on grading of movement and decreased overflow activation of UE and L side.  Performed sit > squat + maintaining squat position with UE support on chair in front of patient x 3 reps to facilitate activation of distal hamstring muscles and ankle DF while maintaining COG in midline over BOS.  Pt fatigued quickly and requested to sit.    See FIM for current functional status  Therapy/Group: Individual Therapy  Raylene Everts [facility] 11/27/2014, 2:52 PM

## 2014-11-28 ENCOUNTER — Inpatient Hospital Stay (HOSPITAL_COMMUNITY): Payer: Medicare Other | Admitting: Rehabilitation

## 2014-11-28 ENCOUNTER — Inpatient Hospital Stay (HOSPITAL_COMMUNITY): Payer: Medicare Other | Admitting: Physical Therapy

## 2014-11-28 NOTE — Progress Notes (Signed)
Subjective/Complaints: No new complaints. Up at eob. Ready for breakfast.  Review of Systems - Negative except weak on R side Objective: Vital Signs: Blood pressure 142/96, pulse 53, temperature 98.1 F (36.7 C), temperature source Oral, resp. rate 18, height 5\' 5"  (1.651 m), weight 86.7 kg (191 lb 2.2 oz), SpO2 100 %. No results found. No results found for this or any previous visit (from the past 72 hour(s)).   HEENT: normal Cardio: RRR and no murmur Resp: CTA B/L and unlabored GI: BS positive and NT, ND Extremity:  Pulses positive and Edema mild Right hand, mild edema LLE>RLE, neg homan's, no consistent pain with ROM or palpation Skin:   Intact Neuro: Alert/Oriented, Cranial Nerve II-XII normal, Abnormal Sensory reduced R UE and RLE, Abnormal Motor 3+ to 4-/ 5 r Delt, bi, tri grip, HF, 4- R KE, 3- R ADF and Tone:  Within Normal Limits, good sitting balance Cerebellar Mild dysmetria right finger nose finger Musc/Skel:  Normal Gen NAD   Assessment/Plan: 1. 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  which require 3+ hours per day of interdisciplinary therapy in a comprehensive inpatient rehab setting. Physiatrist is providing close team supervision and 24 hour management of active medical problems listed below. Physiatrist and rehab team continue to assess barriers to discharge/monitor patient progress toward functional and medical goals.  FIM: FIM - Bathing Bathing Steps Patient Completed: Chest, Right Arm, Left Arm, Abdomen, Front perineal area, Buttocks, Left upper leg, Right upper leg, Right lower leg (including foot), Left lower leg (including foot) Bathing: 4: Steadying assist  FIM - Upper Body Dressing/Undressing Upper body dressing/undressing steps patient completed: Thread/unthread right bra strap, Thread/unthread left bra strap, Thread/unthread right sleeve of pullover shirt/dresss, Put head through opening of pull over shirt/dress,  Thread/unthread left sleeve of pullover shirt/dress, Pull shirt over trunk, Hook/unhook bra Upper body dressing/undressing: 5: Set-up assist to: Obtain clothing/put away FIM - Lower Body Dressing/Undressing Lower body dressing/undressing steps patient completed: Thread/unthread right underwear leg, Thread/unthread left underwear leg, Pull underwear up/down, Pull pants up/down, Thread/unthread left pants leg, Thread/unthread right pants leg, Don/Doff right shoe, Don/Doff left shoe, Fasten/unfasten right shoe, Fasten/unfasten left shoe Lower body dressing/undressing: 4: Steadying Assist  FIM - Toileting Toileting steps completed by patient: Adjust clothing prior to toileting, Performs perineal hygiene, Adjust clothing after toileting Toileting Assistive Devices: Grab bar or rail for support Toileting: 5: Supervision: Safety issues/verbal cues  FIM - Radio producer Devices: Grab bars Toilet Transfers: 4-To toilet/BSC: Min A (steadying Pt. > 75%), 4-From toilet/BSC: Min A (steadying Pt. > 75%)  FIM - Bed/Chair Transfer Bed/Chair Transfer Assistive Devices: Arm rests, Bed rails Bed/Chair Transfer: 4: Sit > Supine: Min A (steadying pt. > 75%/lift 1 leg), 4: Bed > Chair or W/C: Min A (steadying Pt. > 75%), 4: Chair or W/C > Bed: Min A (steadying Pt. > 75%)  FIM - Locomotion: Wheelchair Distance: 100 Locomotion: Wheelchair: 1: Total Assistance/staff pushes wheelchair (Pt<25%) FIM - Locomotion: Ambulation Locomotion: Ambulation Assistive Devices: Administrator, Orthosis Ambulation/Gait Assistance: 3: Mod assist Locomotion: Ambulation: 1: Travels less than 50 ft with moderate assistance (Pt: 50 - 74%)  Comprehension Comprehension Mode: Auditory Comprehension: 6-Follows complex conversation/direction: With extra time/assistive device  Expression Expression Mode: Verbal Expression: 5-Expresses complex 90% of the time/cues < 10% of the time  Social  Interaction Social Interaction: 6-Interacts appropriately with others with medication or extra time (anti-anxiety, antidepressant).  Problem Solving Problem Solving: 5-Solves basic problems:  With no assist  Memory Memory: 4-Recognizes or recalls 75 - 89% of the time/requires cueing 10 - 24% of the time  Medical Problem List and Plan: 1. 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  2. DVT Prophylaxis/Anticoagulation: Pharmaceutical: Lovenox,plt 281K 3. Pain Management: prob sensory dyseasthesias RLE 4. Mood: LCSW to follow for evaluation and support.  5. Neuropsych: This patient is capable of making decisions on her own behalf. 6. Skin/Wound Care: Routine pressure relief measures. Maintain adequate intake.  7. Fluids/Electrolytes/Nutrition: Document I/O. Push po fluids as poor output reported. Will check lytes in am.  8. HTN: Non-compliance? --On atenolol daily with clonidine .3mg  TID. Norvasc 5mg  just added Friday---no changes today 9. Wheezing: patient noted to have SOB as well as bradycardia.   atenolol decreased to 50 mg daily.  -rhinocort 10. Urinary retention: improved   LOS (Days) 12 A FACE TO FACE EVALUATION WAS PERFORMED  SWARTZ,ZACHARY T 11/28/2014, 8:21 AM

## 2014-11-28 NOTE — Progress Notes (Signed)
Physical Therapy Session Note  Patient Details  Name: Brenda Blanchard MRN: [id number] Date of Birth: 10/29/1933  Today's Date: 11/28/2014 PT Individual Time: 7673-4193 PT Individual Time Calculation (min): 60 min   Short Term Goals: Week 2:  PT Short Term Goal 1 (Week 2): STG's = LTG's secondary to anticipated LOS.  Skilled Therapeutic Interventions/Progress Updates:   Pt received sitting in w/c in room, agreeable to therapy session.  Assisted with donning shoes prior to leaving room for time management.  Skilled session focused on NMR for RLE through squat position, graded movement in this position, graded/coordinated movement through functional use of RUE, sit<>stand for increased WB through RLE, sit>mini squat>stand position, activity to increased forward weight shift with sitting/standing, and gait training with use of RW and R hand orthosis for improved quality.  Assessed BP prior to session with regular cuff and pt with sweater on arm, reading was 193/72, therefore had pt remove sweater from arm and got larger cuff for better read.  New read was 179/68, RN made aware and states to continue therapy.  Performed several reaching tasks throughout session first in squatted position for increased quad and glute activation>progressing to stand>mini squat>stand with reaching task.  Requires max verbal cues and max facilitation for forward weight shift and for increased WB through RLE, as she continues to stand with heavy reliance on UEs, esp L causing increased L rotation when standing.  She continues to be resistant to trying new ways of performing task, but then states "I'll try."  Performed gait x 2 reps of 105' with RW and R hand orthosis at mod A level.  Pt continues to demonstrate very forward flexed posture and decreased clearance of RLE.  Note RLE clearance improved somewhat with max facilitation of upright posture, however she was unable to maintain this throughout gait.  Also demonstrates very unsafe  behaviors when sitting to surface as she does not back up all the way with RW, lets go of RW too soon, and does not ensure feet in proper placement when sitting/standing.  Pt assisted back to w/c and back to room. Left in room in w/c with all needs in reach.   Therapy Documentation Precautions:  Precautions Precautions: Fall Restrictions Weight Bearing Restrictions: No  Pain: Pt with mild c/o pain in R knee/thigh following NMR activity.  Allowed rest breaks as needed to reduce pain.   See FIM for current functional status  Therapy/Group: Individual Therapy  Denice Bors 11/28/2014, 12:47 PM

## 2014-11-29 ENCOUNTER — Inpatient Hospital Stay (HOSPITAL_COMMUNITY): Payer: Medicare Other | Admitting: Occupational Therapy

## 2014-11-29 ENCOUNTER — Inpatient Hospital Stay (HOSPITAL_COMMUNITY): Payer: Medicare Other

## 2014-11-29 ENCOUNTER — Ambulatory Visit (HOSPITAL_COMMUNITY): Payer: Medicare Other | Admitting: Speech Pathology

## 2014-11-29 NOTE — Progress Notes (Signed)
Speech Language Pathology Daily Session Note  Patient Details  Name: Brenda Blanchard MRN: [id number] Date of Birth: [birth year]  Today's Date: 11/29/2014 SLP Group Time: 1300-1400 SLP Group Time Calculation (min): 60 min  Short Term Goals: Week 2: SLP Short Term Goal 1 (Week 2): Pt will improve carryover use of word finding strategies in conversations in 90% of observable opportunities wtih supervision verbal cues. SLP Short Term Goal 2 (Week 2): Pt will improve complex functional problem solving with supervision level question cues to self-monitor and correct errors over 90% of observable opportunities.  SLP Short Term Goal 3 (Week 2): Pt will improve recall of daily information via use of compensatory aids, self-initiated in 90% of observable opportunities with supervision level question cues.   Skilled Therapeutic Interventions:   Pt was seen for skilled group ST targeting cognitive goals.  SLP facilitated the session with a previously taught semi-complex card game targeting planning, organization, and mental flexibility.  Pt recalled rules of the game with supervision question cues. Pt then completed the abovementioned task with min assist for recognition and correction of errors.  SLP also facilitated the session with a semi-complex structured activity targeting delayed recall of new information.  Pt completed the abovementioned activity with min cues for working memory.  Continue per current plan of care.   FIM:  Comprehension Comprehension Mode: Auditory Comprehension: 6-Follows complex conversation/direction: With extra time/assistive device Expression Expression Mode: Verbal Expression: 5-Expresses complex 90% of the time/cues < 10% of the time Social Interaction Social Interaction: 6-Interacts appropriately with others with medication or extra time (anti-anxiety, antidepressant). Problem Solving Problem Solving: 4-Solves basic 75 - 89% of the time/requires cueing 10 - 24% of the  time Memory Memory: 4-Recognizes or recalls 75 - 89% of the time/requires cueing 10 - 24% of the time  Pain Pain Assessment Pain Assessment: No/denies pain  Therapy/Group: Group Therapy  Kwanza Cancelliere, Selinda Orion 11/29/2014, 2:25 PM

## 2014-11-29 NOTE — Progress Notes (Signed)
Occupational Therapy Session Note  Patient Details  Name: Brenda Blanchard MRN: [id number] Date of Birth: 1933/10/10  Today's Date: 11/29/2014 OT Individual Time: 2263-3354 OT Individual Time Calculation (min): 60 min    Short Term Goals: Week 2:  OT Short Term Goal 1 (Week 2): Pt will complete toilet transfers with supervision at w/c level OT Short Term Goal 2 (Week 2): Pt will complete shower transfer with min assist from w/c level OT Short Term Goal 3 (Week 2): Pt will complete bathing with supervision OT Short Term Goal 4 (Week 2): Pt will complete LB dressing with supervision OT Short Term Goal 5 (Week 2): Pt will complete 2 grooming tasks in standing with min assist for standing balance and use of RUE as dominant  Skilled Therapeutic Interventions/Progress Updates:    Engaged in ADL retraining with focus on functional transfers, sit <> stand, standing balance, and functional use of RUE during self-care tasks.  Focused on increased trunk control with sit <> stand and transfers with limiting use of BUE "pulling" up on grab bars or sink side, to allow for increased muscle activation.  Pt min/steady assist with standing when washing buttocks and when pulling up pants.  Continues to require verbal cues for motor control and muscle activation as pt tends to want to pull up to standing.  Pt demonstrated increased active use of RUE during self-care tasks with use during grooming approx 50% of time with oral hygiene.  Provided pt with increased challenge with [facility] HEP to further address RUE [facility] and forced use.    Therapy Documentation Precautions:  Precautions Precautions: Fall Restrictions Weight Bearing Restrictions: No General:   Vital Signs: Therapy Vitals Temp: 98.5 F (36.9 C) Temp Source: Oral Pulse Rate: (!) 50 Resp: 19 BP: (!) 148/63 mmHg Patient Position (if appropriate): Lying Oxygen Therapy SpO2: 95 % O2 Device: Not Delivered Pain:  Pt with no c/o pain  See FIM for  current functional status  Therapy/Group: Individual Therapy  [name] 11/29/2014, 8:36 AM

## 2014-11-29 NOTE — Discharge Summary (Addendum)
Physician Discharge Summary  Patient ID: Brenda Blanchard MRN: [id number] DOB/AGE: 1934/04/26 81 y.o.  Admit date: 11/11/2014 Discharge date: 11/16/2014  Admission Diagnoses:Right hemiparesis, right face weakness.   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 female with a past medical history significant for untreated HTN for quite some time, brought in via EMS due to acute onset of right hemiparesis, right face weakness. As per EMS, she was in the bathroom getting ready and had a sudden onset on right sided weakness and facial droop. SBP>200 upon EMS arrival. She reportedly took 2 tabs aspirin before calling EMS. Initial evaluation in the ED demonstrated a right hemiparesis with mild right face drop but preserved mental status. Received 10 mg IV labetalol. CT brain was reviewed by myself and showed a 20 x 22 mm area of hyperdense hemorrhage in the superior left thalamus, tracking into the posterior left corona radiata. Estimated intra-axial hemorrhage volume is 7 mL. There is intraventricular extension, with a small volume of intraventricular hemorrhage. No ventriculomegaly. Trace rightward midline shift.  Denies HA, vertigo, double vision, slurred speech, or visual disturbances. PTT 29, INR 1.09, platelets 187.  Date last known well: 11/11/14 Time last known well: 1:50 PM tPA Given: no ,ICH Patient did well during the hospital course. She was initially kept in the intensive care unit for 2 days her blood pressure was tightly controlled. Repeat CT scan shows stable appearance of the hematoma with intraventricular extension but without significant hydrocephalus. Transthoracic echo showed  normal ejection fraction. HDL cholesterol was elevated at 125. Hemoglobin A1c was 6.6. Patient had mild right upper extremity weakness and ataxia. She was transferred to the neurology floor where she remained stable. She had history of noncompliance with a blood pressure medications and she was counseled and medications were started. She was seen by physical occupational therapy and felt to be a good patient for inpatient rehabilitation. She was seen on consultation by rehabilitation M.D. and felt stable to be transferred to inpatient rehabilitation 11/16/14. Consults: rehabilitation medicine  Significant Diagnostic Studies:  See above  Discharge Exam: Blood pressure 168/72, pulse 55, temperature 98.8 F (37.1 C), temperature source Oral, resp. rate 20, height 5\' 5"  (1.651 m), weight 190 lb (86.183 kg), SpO2 97 %. Mental Status -  Awake alert and orientation to self and place, not oriented to time, age, people Speech mildly nonfluent able to repeat, but word salad, impaired on naming parts of objects. Follows midline and the peripheral simple commands, not able to follow complex commands.   Cranial Nerves II - XII - II - Visual field intact OU. III, IV, VI - Extraocular movements intact. V - Facial sensation intact bilaterally. VII - mild right lower facial droop. VIII - Hearing & vestibular intact bilaterally. X - Palate elevates symmetrically. XI - Chin turning & shoulder shrug intact bilaterally. XII - Tongue protrusion intact.  Motor Strength - The patient's strength was 4-/5 right upper extremity with pronator drift was absent, right lower extremity proximal 5/5, distal 4/5, left upper and lower extremity 5/5. Bulk was normal and fasciculations were absent.  Motor Tone - Muscle tone was assessed at the neck and appendages and was normal.  Reflexes - The patient's reflexes were 1+ in all extremities and she had equivocal pathological reflexes.  Sensory -  Light touch,  temperature/pinprick were assessed and were decreased on the right face.   Coordination - The patient had normal movements in the left hand, ataxic on the right FTN but not out of proportion to the weakness. Tremor was absent  Disposition: 62-Rehab Facility  Discharge Instructions    Ambulatory referral to Neurology    Complete by:  As directed   Stroke patient. Dr. Leonie Man prefers follow up in 1 month           Home Discharge Medications   Medication List    TAKE these medications                atenolol 100 MG tablet  Commonly known as:  TENORMIN  Take 100 mg by mouth daily.     cloNIDine 0.3 MG tablet  Commonly known as:  CATAPRES  Take 0.3 mg by mouth 2 (two) times daily.        If d/c to Inpatient Rehab, Medications to be continued on Rehab       Follow-up Information    Follow up with Zuleyka Kloc, MD In 1 month.   Specialties:  Neurology, Radiology   Why:  Stroke Clinic, Office will call you with appointment date & time   Contact information:   [place] [address] [phone]       Follow up with primary care MD In 1 month.   Why:  for hospital follow up    Time spent on DC summary 37 minutes  Signed: Caedon Bond 11/29/2014, 3:41 PM

## 2014-11-29 NOTE — Progress Notes (Addendum)
Physical Therapy Session Note  Patient Details  Name: Brenda Blanchard MRN: [id number] Date of Birth: 1934-07-01  Today's Date: 11/29/2014 PT Individual Time: 1000-1100 PT Individual Time Calculation (min): 60 min   Short Term Goals: Week 2:  PT Short Term Goal 1 (Week 2): STG's = LTG's secondary to anticipated LOS. Week 3:     Skilled Therapeutic Interventions/Progress Updates:  Tx focused on neuromuscular re-education during functional activities via forced use, manual cues, demo, visual feedback from mirror, VCs for:  - trunk shortening/lengthening/rotating during functional reach slightly out of BOS - pelvic dissociation during scooting  -midline orientation in dynamic sitting and static standing - gait.    Gait with weighted grocery cart x 150' with min/mod assist; VCs for R foot clearance.  While resting in room, pt asked for exs; PT suggested visually monitoring RLE during hip flexion and knee ext/flex. Therapy Documentation Precautions:  Precautions Precautions: Fall Precaution Comments: bil knee OA Restrictions Weight Bearing Restrictions: No   Vital Signs: Therapy Vitals BP: (!) 145/70 mmHg Patient Position (if appropriate): Sitting  HR 43 O2 sats 97% on room air Pain: Pain Assessment Pain Assessment: No/denies pain      See FIM for current functional status  Therapy/Group: Individual Therapy  Janeliz Prestwood 11/29/2014, 12:39 PM

## 2014-11-29 NOTE — Progress Notes (Signed)
Subjective/Complaints: Room is cold otherwise without complaints  Review of Systems - Negative except weak on R side Objective: Vital Signs: Blood pressure 148/63, pulse 50, temperature 98.5 F (36.9 C), temperature source Oral, resp. rate 19, height 5\' 5"  (1.651 m), weight 86.7 kg (191 lb 2.2 oz), SpO2 95 %. No results found. No results found for this or any previous visit (from the past 72 hour(s)).   HEENT: normal Cardio: RRR and no murmur Resp: CTA B/L and unlabored GI: BS positive and NT, ND Extremity:  Pulses positive and Edema mild Right hand, mild edema LLE>RLE, neg homan's, no consistent pain with ROM or palpation Skin:   Intact Neuro: Alert/Oriented, Cranial Nerve II-XII normal, Abnormal Sensory reduced R UE and RLE, Abnormal Motor 3+ to 4-/ 5 r Delt, bi, tri grip, HF, 4- R KE, 3- R ADF and Tone:  Within Normal Limits, good sitting balance Cerebellar Mild dysmetria right finger nose finger Musc/Skel:  Normal Gen NAD   Assessment/Plan: 1. 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  which require 3+ hours per day of interdisciplinary therapy in a comprehensive inpatient rehab setting. Physiatrist is providing close team supervision and 24 hour management of active medical problems listed below. Physiatrist and rehab team continue to assess barriers to discharge/monitor patient progress toward functional and medical goals.  FIM: FIM - Bathing Bathing Steps Patient Completed: Chest, Right Arm, Left Arm, Abdomen, Front perineal area, Buttocks, Left upper leg, Right upper leg, Right lower leg (including foot), Left lower leg (including foot) Bathing: 4: Steadying assist  FIM - Upper Body Dressing/Undressing Upper body dressing/undressing steps patient completed: Thread/unthread right bra strap, Thread/unthread left bra strap, Thread/unthread right sleeve of pullover shirt/dresss, Put head through opening of pull over shirt/dress,  Thread/unthread left sleeve of pullover shirt/dress, Pull shirt over trunk, Hook/unhook bra Upper body dressing/undressing: 5: Set-up assist to: Obtain clothing/put away FIM - Lower Body Dressing/Undressing Lower body dressing/undressing steps patient completed: Pull pants up/down, Pull underwear up/down Lower body dressing/undressing: 3: Mod-Patient completed 50-74% of tasks  FIM - Toileting Toileting steps completed by patient: Performs perineal hygiene, Adjust clothing after toileting, Adjust clothing prior to toileting Toileting Assistive Devices: Grab bar or rail for support Toileting: 4: Steadying assist  FIM - Radio producer Devices: Grab bars Toilet Transfers: 4-To toilet/BSC: Min A (steadying Pt. > 75%)  FIM - Bed/Chair Transfer Bed/Chair Transfer Assistive Devices: Bed rails, Arm rests Bed/Chair Transfer: 4: Supine > Sit: Min A (steadying Pt. > 75%/lift 1 leg)  FIM - Locomotion: Wheelchair Distance: 100 Locomotion: Wheelchair: 1: Total Assistance/staff pushes wheelchair (Pt<25%) FIM - Locomotion: Ambulation Locomotion: Ambulation Assistive Devices: Administrator, Orthosis Ambulation/Gait Assistance: 3: Mod assist Locomotion: Ambulation: 1: Travels less than 50 ft with moderate assistance (Pt: 50 - 74%)  Comprehension Comprehension Mode: Auditory Comprehension: 6-Follows complex conversation/direction: With extra time/assistive device  Expression Expression Mode: Verbal Expression: 5-Expresses complex 90% of the time/cues < 10% of the time  Social Interaction Social Interaction: 6-Interacts appropriately with others with medication or extra time (anti-anxiety, antidepressant).  Problem Solving Problem Solving: 5-Solves basic problems: With no assist  Memory Memory: 4-Recognizes or recalls 75 - 89% of the time/requires cueing 10 - 24% of the time  Medical Problem List and Plan: 1. Functional deficits secondary to Left thalamic to  internal capsule ICH.  2. DVT Prophylaxis/Anticoagulation: Pharmaceutical: Lovenox,plt 281K 3. Pain Management: prob sensory dyseasthesias RLE 4. Mood: LCSW to follow for evaluation and support.  5. Neuropsych: This patient is capable of making decisions on her own behalf. 6. Skin/Wound Care: Routine pressure relief measures. Maintain adequate intake.  7. Fluids/Electrolytes/Nutrition: Document I/O. Push po fluids as poor output reported.  8. HTN: Non-compliance? --Improving On atenolol daily with clonidine .3mg  TID. Norvasc 5mg  added 9. Wheezing: patient noted to have SOB as well as bradycardia.   atenolol decreased to 50 mg daily.  -rhinocort 10. Urinary retention: improved   LOS (Days) 13 A FACE TO FACE EVALUATION WAS PERFORMED  KIRSTEINS,ANDREW E 11/29/2014, 6:37 AM

## 2014-11-29 NOTE — Plan of Care (Signed)
Problem: RH Balance Goal: LTG Patient will maintain dynamic standing with ADLs (OT) LTG: Patient will maintain dynamic standing balance with assist during activities of daily living (OT)  Downgraded due to slow progress and decreased safety awareness  Problem: RH Bathing Goal: LTG Patient will bathe with assist, cues/equipment (OT) LTG: Patient will bathe specified number of body parts with assist with/without cues using equipment (position) (OT)  Downgraded due to slow progress and decreased safety awareness  Problem: RH Dressing Goal: LTG Patient will perform lower body dressing w/assist (OT) LTG: Patient will perform lower body dressing with assist, with/without cues in positioning using equipment (OT)  Downgraded due to slow progress and decreased safety awareness  Problem: RH Toileting Goal: LTG Patient will perform toileting w/assist, cues/equip (OT) LTG: Patient will perform toiletiing (clothes management/hygiene) with assist, with/without cues using equipment (OT)  Downgraded due to slow progress and decreased safety awareness

## 2014-11-30 ENCOUNTER — Inpatient Hospital Stay (HOSPITAL_COMMUNITY): Payer: Medicare Other | Admitting: *Deleted

## 2014-11-30 ENCOUNTER — Inpatient Hospital Stay (HOSPITAL_COMMUNITY): Payer: Medicare Other | Admitting: Occupational Therapy

## 2014-11-30 ENCOUNTER — Inpatient Hospital Stay (HOSPITAL_COMMUNITY): Payer: Medicare Other | Admitting: Speech Pathology

## 2014-11-30 LAB — CREATININE, SERUM
Creatinine, Ser: 0.92 mg/dL (ref 0.50–1.10)
GFR calc Af Amer: 67 mL/min — ABNORMAL LOW (ref >90)
GFR calc non Af Amer: 58 mL/min — ABNORMAL LOW (ref >90)

## 2014-11-30 MED ORDER — AMLODIPINE BESYLATE 10 MG PO TABS
10.0000 mg | ORAL_TABLET | Freq: Every day | ORAL | Status: DC
  Administered 2014-11-30 – 2014-12-04 (×5): 10 mg via ORAL
  Filled 2014-11-30 (×6): qty 1

## 2014-11-30 MED ORDER — ATENOLOL 25 MG PO TABS
25.0000 mg | ORAL_TABLET | Freq: Every day | ORAL | Status: DC
  Administered 2014-11-30 – 2014-12-04 (×5): 25 mg via ORAL
  Filled 2014-11-30 (×6): qty 1

## 2014-11-30 NOTE — Progress Notes (Signed)
Speech Language Pathology Weekly Progress and Session Note  Patient Details  Name: Brenda Blanchard MRN: [id number] Date of Birth: [birth year]  Beginning of progress report period: November 23, 2014 End of progress report period: November 30, 2014  Today's Date: 11/30/2014 SLP Individual Time: 0800-0900 SLP Individual Time Calculation (min): 60 min  Short Term Goals: Week 2: SLP Short Term Goal 1 (Week 2): Pt will improve carryover use of word finding strategies in conversations in 90% of observable opportunities wtih supervision verbal cues. SLP Short Term Goal 1 - Progress (Week 2): Met SLP Short Term Goal 2 (Week 2): Pt will improve complex functional problem solving with supervision level question cues to self-monitor and correct errors over 90% of observable opportunities.  SLP Short Term Goal 2 - Progress (Week 2): Progressing toward goal SLP Short Term Goal 3 (Week 2): Pt will improve recall of daily information via use of compensatory aids, self-initiated in 90% of observable opportunities with supervision level question cues.  SLP Short Term Goal 3 - Progress (Week 2): Met    New Short Term Goals: Week 3: SLP Short Term Goal 1 (Week 3): Pt will improve carryover use of word finding strategies in conversations in 90% of observable opportunities wtih Mod I SLP Short Term Goal 2 (Week 3): Pt will improve complex functional problem solving with supervision level question cues to self-monitor and correct errors over 90% of observable opportunities.  SLP Short Term Goal 3 (Week 3): Pt will improve recall of daily information via use of compensatory aids, self-initiated in 90% of observable opportunities with mod I   Weekly Progress Updates:  Pt made functional gains this reporting period and has met 2 out of 3 short term goals.  Pt currently requires supervision-min assist during cognitive-linguistic tasks due to decreased executive function, decreased short term memory, and decreased working  memory.  As a result, SLP continues to recommend 24/7 supervision at discharge, assistance for medication and financial management, and ST follow up at next level of care.  Pt and family education is ongoing.  Pt would continue to benefit from skilled ST while inpatient in order to maximize functional independence and reduce burden of care prior to discharge.     Intensity: Minumum of 1-2 x/day, 30 to 90 minutes Frequency: 3 to 5 out of 7 days Duration/Length of Stay: 11/30/14 Treatment/Interventions: Cognitive remediation/compensation;Functional tasks;Cueing hierarchy;Internal/external aids;Multimodal communication approach;Speech/Language facilitation;Patient/family education   Daily Session  Skilled Therapeutic Interventions: Pt was seen for skilled ST targeting goals for cognition and ongoing patient education.  Upon arrival, pt was seated upright in wheelchair, awake, alert, and agreeable to participate in [place].  SLP facilitated the session with a structured, semi-complex shopping task targeting planning, mental flexibility, organization, and working memory.  Pt completed the abovementioned task with overall supervision cues for locating appropriate items and min assist cues for organization of functional math calculations.  UPon completion of structured activity, SLP reviewed and reinforced education, specifically recommendations that pt have assistance for medication and financial management due to cognitive deficits.  Pt was in agreement with the abovementioned recommendations and stated that her son will be coming in from out of state this Thursday and plans to stay with her for approximately 2 weeks, after which point her granddaughter will be available to help.  Pt is also in agreement with SLP recommendation for follow up ST.  Goals updated on this date to reflect current progress and plan of care.  FIM:  Comprehension Comprehension Mode: Auditory Comprehension: 6-Follows complex  conversation/direction: With extra time/assistive device Expression Expression: 5-Expresses complex 90% of the time/cues < 10% of the time Social Interaction Social Interaction: 6-Interacts appropriately with others with medication or extra time (anti-anxiety, antidepressant). Problem Solving Problem Solving: 4-Solves basic 75 - 89% of the time/requires cueing 10 - 24% of the time Memory Memory: 5-Recognizes or recalls 90% of the time/requires cueing < 10% of the time General    Pain Pain Assessment Pain Assessment: No/denies pain  Therapy/Group: Individual Therapy  Pacen Watford, Selinda Orion 11/30/2014, 12:38 PM

## 2014-11-30 NOTE — Progress Notes (Signed)
Physical Therapy Session Note  Patient Details  Name: Brenda Blanchard MRN: [id number] Date of Birth: 1934/01/22  Today's Date: 11/30/2014 PT Individual Time: 1100-1210 (Co-tx with rec therapist) PT Individual Time Calculation (min): 70 min   Short Term Goals: Week 2:  PT Short Term Goal 1 (Week 2): STG's = LTG's secondary to anticipated LOS.  Skilled Therapeutic Interventions/Progress Updates:    Skilled co-treatment with rec therapist focusing on gait training, D/C planning, and increasing intellectual awareness of functional impairments. Pt received seated in w/c; agreeable to therapy. Pt performed w/c mobility x115' in controlled environment with bilat LE's and supervision, cueing for attention to RLE and R visual field. Pt performed gait 2 x65' in controlled environment with rolling walker, R hand orthosis, and min A during linear gait; during turning, pt required mod A and max cueing for safe proximity of rolling walker, attention to to RLE. ACE bandage effective in increase R ankle dorsiflexion, facilitate adequate RLE clearance during gait.  Due to decreased pt awareness of functional impairments (frequently asking therapists to "let me do it by myself" in reference to ambulation and transfers), utilized video recording of ambulation to illustrate gait instability, need for hands-on assistance with ambulation. Pt with minimal insight of gait instability but did recognize decreased stability with sit<>stand transfers using bilat UE's (as opposed to transfers without UE support). Rec therapist and this PT discussed home entrance with pt. Per pt, pt will need to negotiate 2 steps without rails to enter home. Again, discussed possibility of having rail installed to enable pt to safely enter home. Pt with decreased carryover of similar discussions from previous sessions. Pt describing plan to install grab bar on outside of brick home. Simulated described setup. Pt then negotiated 2 stairs with simulated  L grab bar forward-facing with step-to pattern and mod A for stability. Spoke with CSW [place] about pt-described plan; will need to follow up. Session ended in pt room, where pt was left seated in w/c with lunch tray set up and all needs within reach.  Therapy Documentation Precautions:  Precautions Precautions: Fall Precaution Comments: bil knee OA Restrictions Weight Bearing Restrictions: No Vital Signs: Therapy Vitals Temp: 98.7 F (37.1 C) Temp Source: Oral Resp: 16 Patient Position (if appropriate): Sitting Oxygen Therapy SpO2: 100 % O2 Device: Not Delivered Pain: Pain Assessment Pain Assessment: No/denies pain Locomotion : Ambulation Ambulation/Gait Assistance: 4: Min assist;3: Mod assist Wheelchair Mobility Distance: 115   See FIM for current functional status  Therapy/Group: Individual Therapy and Co-Treatment  [place], Malva Cogan 11/30/2014, 4:30 PM

## 2014-11-30 NOTE — Progress Notes (Signed)
Occupational Therapy Session Note  Patient Details  Name: Brenda Blanchard MRN: [id number] Date of Birth: 09-Feb-1934  Today's Date: 11/30/2014 OT Individual Time: 5361-4431 OT Individual Time Calculation (min): 60 min    Short Term Goals: Week 2:  OT Short Term Goal 1 (Week 2): Pt will complete toilet transfers with supervision at w/c level OT Short Term Goal 2 (Week 2): Pt will complete shower transfer with min assist from w/c level OT Short Term Goal 3 (Week 2): Pt will complete bathing with supervision OT Short Term Goal 4 (Week 2): Pt will complete LB dressing with supervision OT Short Term Goal 5 (Week 2): Pt will complete 2 grooming tasks in standing with min assist for standing balance and use of RUE as dominant  Skilled Therapeutic Interventions/Progress Updates:    Engaged in ADL retraining with focus on functional transfers, sit <> stand, standing balance, and functional use of RUE during self-care tasks.  Pt received in w/c reporting need to toilet.  Stand pivot transfer to toilet min assist with tactile cues for control, completed toileting tasks with lateral leans on toilet and supervision.  Supervision stand pivot back to w/c.  Min assist transfers in/out of shower due to increased speed with transfer.  Bathing completed at sit > stand level with close supervision and use of grab bars, pt required min/steady assist when drying off perineal area in standing.  Increased focus on sit > stand without use of BUE on grab bar on sink ledge with pt able to perform with close supervision/steady assist upon standing when pulling up underwear and pants.  Provided pt with built up/red foam for utensils to assist with self-feeding as pt reports difficulty maintaining grasp on utensils.  Plan to follow up regarding self-feeding during next session.  Therapy Documentation Precautions:  Precautions Precautions: Fall Precaution Comments: bil knee OA Restrictions Weight Bearing Restrictions:  No General:   Vital Signs: Therapy Vitals BP: (!) 132/97 mmHg Pain:  Pt with no c/o pain  See FIM for current functional status  Therapy/Group: Individual Therapy  [name] 11/30/2014, 10:44 AM

## 2014-11-30 NOTE — Progress Notes (Signed)
Subjective/Complaints: No dizziness, no SOB, finally had BM yest, bladder OK  Review of Systems - Negative except weak on R side Objective: Vital Signs: Blood pressure 168/55, pulse 49, temperature 98.4 F (36.9 C), temperature source Oral, resp. rate 17, height 5\' 5"  (1.651 m), weight 86.7 kg (191 lb 2.2 oz), SpO2 95 %. No results found. No results found for this or any previous visit (from the past 72 hour(s)).   HEENT: normal Cardio: RRR and no murmur Resp: CTA B/L and unlabored GI: BS positive and NT, ND Extremity:  Pulses positive and Edema mild Right hand, mild edema LLE>RLE, neg homan's, no consistent pain with ROM or palpation Skin:   Intact Neuro: Alert/Oriented, Cranial Nerve II-XII normal, Abnormal Sensory reduced R UE and RLE, Abnormal Motor 3+ to 4-/ 5 r Delt, bi, tri grip, HF, 4- R KE, 3- R ADF and Tone:  Within Normal Limits, good sitting balance Cerebellar Mild dysmetria right finger nose finger Musc/Skel:  Normal Gen NAD   Assessment/Plan: 1. 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  which require 3+ hours per day of interdisciplinary therapy in a comprehensive inpatient rehab setting. Physiatrist is providing close team supervision and 24 hour management of active medical problems listed below. Physiatrist and rehab team continue to assess barriers to discharge/monitor patient progress toward functional and medical goals.  FIM: FIM - Bathing Bathing Steps Patient Completed: Chest, Right Arm, Left Arm, Abdomen, Front perineal area, Buttocks, Left upper leg, Right upper leg, Right lower leg (including foot), Left lower leg (including foot) Bathing: 4: Steadying assist  FIM - Upper Body Dressing/Undressing Upper body dressing/undressing steps patient completed: Thread/unthread right bra strap, Thread/unthread left bra strap, Thread/unthread right sleeve of pullover shirt/dresss, Put head through opening of pull over shirt/dress,  Thread/unthread left sleeve of pullover shirt/dress, Pull shirt over trunk, Hook/unhook bra Upper body dressing/undressing: 5: Supervision: Safety issues/verbal cues FIM - Lower Body Dressing/Undressing Lower body dressing/undressing steps patient completed: Thread/unthread right underwear leg, Thread/unthread left underwear leg, Pull underwear up/down, Thread/unthread right pants leg, Thread/unthread left pants leg, Pull pants up/down, Don/Doff right shoe, Don/Doff left shoe, Fasten/unfasten right shoe, Fasten/unfasten left shoe Lower body dressing/undressing: 4: Steadying Assist  FIM - Toileting Toileting steps completed by patient: Performs perineal hygiene, Adjust clothing prior to toileting, Adjust clothing after toileting Toileting Assistive Devices: Grab bar or rail for support Toileting: 4: Steadying assist  FIM - Radio producer Devices: Grab bars Toilet Transfers: 4-To toilet/BSC: Min A (steadying Pt. > 75%)  FIM - Bed/Chair Transfer Bed/Chair Transfer Assistive Devices: Arm rests Bed/Chair Transfer: 4: Supine > Sit: Min A (steadying Pt. > 75%/lift 1 leg)  FIM - Locomotion: Wheelchair Distance: 100 Locomotion: Wheelchair: 1: Total Assistance/staff pushes wheelchair (Pt<25%) FIM - Locomotion: Ambulation Locomotion: Ambulation Assistive Devices: Other (comment) (weighted grocery cart) Ambulation/Gait Assistance: 3: Mod assist, 4: Min assist Locomotion: Ambulation: 2: Travels 50 - 149 ft with moderate assistance (Pt: 50 - 74%)  Comprehension Comprehension Mode: Auditory Comprehension: 6-Follows complex conversation/direction: With extra time/assistive device  Expression Expression Mode: Verbal Expression: 5-Expresses complex 90% of the time/cues < 10% of the time  Social Interaction Social Interaction: 6-Interacts appropriately with others with medication or extra time (anti-anxiety, antidepressant).  Problem Solving Problem Solving: 5-Solves  basic problems: With no assist  Memory Memory: 6-More than reasonable amt of time  Medical Problem List and Plan: 1. 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  2. DVT Prophylaxis/Anticoagulation:  Pharmaceutical: [employer] 3. Pain Management: prob sensory dyseasthesias RLE 4. Mood: LCSW to follow for evaluation and support.  5. Neuropsych: This patient is capable of making decisions on her own behalf. 6. Skin/Wound Care: Routine pressure relief measures. Maintain adequate intake.  7. Fluids/Electrolytes/Nutrition: Document I/O. Push po fluids as poor output reported.  8. HTN: Non-compliance? --Improving On atenolol daily with clonidine .3mg  TID. Norvasc increased to 10mg   9. Wheezing: patient noted to have SOB as well as bradycardia.   atenolol decreased to 25 mg daily.  -rhinocort 10. Urinary retention: improved   LOS (Days) 14 A FACE TO FACE EVALUATION WAS PERFORMED  KIRSTEINS,ANDREW E 11/30/2014, 6:59 AM

## 2014-11-30 NOTE — Progress Notes (Signed)
Recreational Therapy Session Note  Patient Details  Name: Brenda Blanchard MRN: [id number] Date of Birth: [birth year] Today's Date: 11/30/2014  Pain: no c/o  Skilled Therapeutic Interventions/Progress Updates: Session focused on activity tolerance, dynamic standing balance, ambulation, weight shifting, & discharge planning.  Pt stood to kick a ball alternating feet with min-mod assist.  Discussion with pt about discharge and home entry.  Pt explained home set up & her plan for a grab bar to be placed to assist with management of steps at garage entrance.  Simulated 2 steps per pt explanation with mod assist.    Therapy/Group: Co-Treatment Eward Rutigliano 11/30/2014, 4:36 PM

## 2014-12-01 ENCOUNTER — Inpatient Hospital Stay (HOSPITAL_COMMUNITY): Payer: Medicare Other | Admitting: Occupational Therapy

## 2014-12-01 ENCOUNTER — Inpatient Hospital Stay (HOSPITAL_COMMUNITY): Payer: Medicare Other | Admitting: Speech Pathology

## 2014-12-01 ENCOUNTER — Inpatient Hospital Stay (HOSPITAL_COMMUNITY): Payer: Medicare Other | Admitting: *Deleted

## 2014-12-01 ENCOUNTER — Inpatient Hospital Stay (HOSPITAL_COMMUNITY): Payer: Medicare Other | Admitting: Physical Therapy

## 2014-12-01 NOTE — Progress Notes (Signed)
Social Work [name], [place] Social Worker Signed  Patient Care Conference 12/01/2014  2:35 PM    Expand All Collapse All   Inpatient RehabilitationTeam Conference and Plan of Care Update Date: 12/01/2014   Time: 10;45 AM     Patient Name: Brenda Blanchard       Medical Record Number: [id number]  Date of Birth: Apr 13, 1944 Sex: Female         Room/Bed: 4M02C/4M02C-01 Payor Info: Payor: MEDICARE / Plan: MEDICARE PART A AND B / Product Type: *No Product type* /    Admitting Diagnosis: L ICH   Admit Date/Time:  11/16/2014  5:42 PM Admission Comments: No comment available   Primary Diagnosis:  ICH (intracerebral hemorrhage) Principal Problem: ICH (intracerebral hemorrhage)    Patient Active Problem List     Diagnosis  Date Noted   .  Numbness of face     .  Wheezing without diagnosis of asthma     .  Right hemiparesis  11/17/2014   .  ICH (intracerebral hemorrhage)  11/16/2014   .  Essential hypertension  11/16/2014   .  Intracerebral hemorrhage of other cerebral location     .  Stroke due to intracerebral hemorrhage  11/11/2014     Expected Discharge Date: Expected Discharge Date: 12/04/14  Team Members Present: Physician leading conference: Dr. Alysia Penna Social Worker Present: Ovidio Kin, LCSW Nurse Present: Heather Roberts, RN PT Present: Billie Ruddy, Renaye Rakers, PT OT Present: Willeen Cass, OT;Julia Saguier, Jules Schick, OT SLP Present: Windell Moulding, SLP        Current Status/Progress  Goal  Weekly Team Focus   Medical     Right upper extremity numbness and decreased functional usage   Home discharge  Discharge planning, family training    Bowel/Bladder     Continent of bowel and bladder. LBM 11/29/14   Continent of bowel and bladder  Monitor   Swallow/Nutrition/ Hydration       na         ADL's     min assist stand pivot transfers, min assist bathing and dressing.  Pt demonstrates Rt lean in standing requiring verbal and occasional tactile cues to  correct  supervision overall, mod I grooming, UB dressing, and self-feeding  transfers, sit <> stand, standing balance, RUE NMR, home making tasks   Mobility     Min A basic transfers, Mod A gait and stairs   Downgraded to supervision at w/c level, Min A ambulation and stairs  R NMR, safety awareness, attention to RUE/RLE, postural/gait stability, stair negotiation, D/C planning, family training    Communication     Mod I   Mod I, goal met       Safety/Cognition/ Behavioral Observations    supervision-min assist for recall and complex problem solving    supervision  continue to increase recall of complex information and emergent awareness of deficits   Pain     No c/o pain  <3  Assess for nonverbal cues of pain   Skin     CDI  CDI  Assess skin q shift, encourage routine turn q 2hrs      *See Care Plan and progress notes for long and short-term goals.    Barriers to Discharge:  None anticipated for upcoming discharge except needs to complete caregiver training     Possible Resolutions to Barriers:   See above     Discharge Planning/Teaching Needs:   Son and granddaughter flying in [place] will be here  Friday for family training and prepare for discharge Sat.  Aware pt requires 24 hr care       Team Discussion:    Making progress BP better-meds changes. Safety awareness flucuates.  Downgraded goals-to supervision/min wheelchair level.  Family education Friday 10;00 with son and granddaughter.   Revisions to Treatment Plan:    Downgraded goals-supervision/min-ambulation    Continued Need for Acute Rehabilitation Level of Care: The patient requires daily medical management by a physician with specialized training in physical medicine and rehabilitation for the following conditions: Daily direction of a multidisciplinary physical rehabilitation program to ensure safe treatment while eliciting the highest outcome that is of practical value to the patient.: Yes Daily medical management of  patient stability for increased activity during participation in an intensive rehabilitation regime.: Yes Daily analysis of laboratory values and/or radiology reports with any subsequent need for medication adjustment of medical intervention for : Neurological problems  Kendric Sindelar, Gardiner Rhyme 12/01/2014, 2:35 PM                 [name], LCSW Social Worker Signed  Patient Care Conference 11/24/2014  1:59 PM    Expand All Collapse All   Inpatient RehabilitationTeam Conference and Plan of Care Update Date: 11/24/2014   Time: 11;15 AM     Patient Name: Brenda Blanchard       Medical Record Number: [id number]  Date of Birth: [birth year] Sex: Female         Room/Bed: 4M02C/4M02C-01 Payor Info: Payor: MEDICARE / Plan: MEDICARE PART A AND B / Product Type: *No Product type* /    Admitting Diagnosis: L ICH   Admit Date/Time:  11/16/2014  5:42 PM Admission Comments: No comment available   Primary Diagnosis:  ICH (intracerebral hemorrhage) Principal Problem: ICH (intracerebral hemorrhage)    Patient Active Problem List     Diagnosis  Date Noted   .  Numbness of face     .  Wheezing without diagnosis of asthma     .  Right hemiparesis  11/17/2014   .  ICH (intracerebral hemorrhage)  11/16/2014   .  Essential hypertension  11/16/2014   .  Intracerebral hemorrhage of other cerebral location     .  Stroke due to intracerebral hemorrhage  11/11/2014     Expected Discharge Date: Expected Discharge Date: 12/04/14  Team Members Present: Physician leading conference: Dr. Alysia Penna Social Worker Present: Ovidio Kin, LCSW Nurse Present: Heather Roberts, RN PT Present: Georjean Mode, PT;Blair Hobble, PT OT Present: [name], OT SLP Present: Gunnar Fusi, SLP;Nicole Page, SLP PPS Coordinator present : Daiva Nakayama, RN, CRRN        Current Status/Progress  Goal  Weekly Team Focus   Medical     poor safety awareness  maximize NM recovery  safety awareness increase   Bowel/Bladder      PVRs have been low consistenly, voiding appropiately. LBM 2/16, loose  Continent of bowel and bladder  Remain cont of bowel and bladder   Swallow/Nutrition/ Hydration       na         ADL's     min assist stand pivot transfers, min assist bathing and dressing.  Pt demonstrates Rt lean in sitting and increasingly in standing requiring tactile cues to promote midline awareness   mod I self-care tasks, supervision higher level IADLs   transfers, sit <> stand, standing balance, RUE NMR, home making tasks   Mobility     Min A bed mobility,  Min to Mod A basic transfers, Mod to +2A gait and stairs  Mod I for transfers and w/c mobility/management; supervision ambulation; min A stairs  R NMR, postural/gait stability, stair negotiation, continue hands-on family training   Communication     Supervision   Mod I  carryover of word finding strategies     Safety/Cognition/ Behavioral Observations    Supervision-Min assist for recall and complex problem solving    Supervision   increase recall and emergent awarenss of deficits    Pain     No complaints of pain  pain less than 2  Assess pain qshift and PRN   Skin     No skin breakdown, minimal bruises to abdomen due to SQ lovenox   No new skin breakdown while on inpatinet rehab   Assess skin q shift and PRN       *See Care Plan and progress notes for long and short-term goals.    Barriers to Discharge:  poor safety     Possible Resolutions to Barriers:   ? trasfer to [place] to be close to      Discharge Planning/Teaching Needs:   Son planning to come at discharge to stay 1-2 weeks, deciding whether pt going back to [place] with him.  Sister local but can not assist        Team Discussion:    Balance issues and higher level memory issues-will need supervision level for safety at home. Refuses quick release belt. Had facial numbness rechecked CT unchanged.  Need family prior to discharge from rehab   Revisions to Treatment Plan:    None    Continued Need  for Acute Rehabilitation Level of Care: The patient requires daily medical management by a physician with specialized training in physical medicine and rehabilitation for the following conditions: Daily direction of a multidisciplinary physical rehabilitation program to ensure safe treatment while eliciting the highest outcome that is of practical value to the patient.: Yes Daily medical management of patient stability for increased activity during participation in an intensive rehabilitation regime.: Yes Daily analysis of laboratory values and/or radiology reports with any subsequent need for medication adjustment of medical intervention for : Neurological problems;Other  [name] 11/24/2014, 1:59 PM                 [name], LCSW Social Worker Signed  Patient Care Conference 11/17/2014  3:28 PM    Expand All Collapse All   Inpatient RehabilitationTeam Conference and Plan of Care Update Date: 11/17/2014   Time: 11;20 AM     Patient Name: Katrinna Travieso       Medical Record Number: [id number]  Date of Birth: 10-Oct-1933 Sex: Female         Room/Bed: 4M02C/4M02C-01 Payor Info: Payor: MEDICARE / Plan: MEDICARE PART A AND B / Product Type: *No Product type* /    Admitting Diagnosis: L ICH   Admit Date/Time:  11/16/2014  5:42 PM Admission Comments: No comment available   Primary Diagnosis:  ICH (intracerebral hemorrhage) Principal Problem: ICH (intracerebral hemorrhage)    Patient Active Problem List     Diagnosis  Date Noted   .  Right hemiparesis  11/17/2014   .  ICH (intracerebral hemorrhage)  11/16/2014   .  Essential hypertension  11/16/2014   .  Intracerebral hemorrhage of other cerebral location     .  Stroke due to intracerebral hemorrhage  11/11/2014     Expected Discharge Date: Expected Discharge Date: 12/04/14  Team Members Present: Physician leading conference: Dr. Alysia Penna Social Worker Present: Ovidio Kin, LCSW Nurse Present: Elliot Cousin,  RN PT Present: Raylene Everts, PT;Caroline Lacinda Axon, PT;Blair Hobble, PT OT Present: [name], OT SLP Present: Windell Moulding, SLP PPS Coordinator present : Daiva Nakayama, RN, CRRN        Current Status/Progress  Goal  Weekly Team Focus   Medical     mild aphasia, mild R HP  Home D/C  initiate therapy program   Bowel/Bladder     Patient retaining urine and requiring in and out caths; continent of bowel; LBM 2/7  Continent of bowel and bladder  Assist patient to [facility] or toilet for all toileting to encourage bladder emptying   Swallow/Nutrition/ Hydration       na         ADL's     mod assist stand pivot transfers, unable to assess dressing due to no clothes on eval, mod assist bathing  mod I self-care tasks, supervision higher level IADLs   transfers, sit <> stand, standing balance, RUE NMR, home making tasks   Mobility     Mod A bed mobility and transfers, +2A gait and stairs  Mod I for transfers and w/c mobility/management; supervision ambulation; min A stairs  R NMR, bed mobility, gait/transfer training, stair negotiation, initiate pt/family education   Communication     mild higher level word finding deficits in conversation   Mod I  education and carryover of compensatory strategies.    Safety/Cognition/ Behavioral Observations    mild higher level deficits for memory and functional problem solving   supervision   education and carryover of compensatory strategies    Pain     Denies  </=3  Assess pain qshift and PRN   Skin     No skin isssues noted  No new skin breakdown  Assess skin qshift and PRN; turn q2hr in bed      *See Care Plan and progress notes for long and short-term goals.    Barriers to Discharge:  has a home in [place]      Possible Resolutions to Barriers:   Will set up      Discharge Planning/Teaching Needs:     Home with son who will be here for 1-2 weeks, otherwise sister can check in on her.  Needs to be mod/i level by discharge      Team  Discussion:    New evaluations-goals mod/i wheelchair level, supervision ambulation. Retaining fluid-I & O cath will see if starts to void.  Sensation and perception affected by stroke.    Revisions to Treatment Plan:    New eval    Continued Need for Acute Rehabilitation Level of Care: The patient requires daily medical management by a physician with specialized training in physical medicine and rehabilitation for the following conditions: Daily direction of a multidisciplinary physical rehabilitation program to ensure safe treatment while eliciting the highest outcome that is of practical value to the patient.: Yes Daily medical management of patient stability for increased activity during participation in an intensive rehabilitation regime.: Yes Daily analysis of laboratory values and/or radiology reports with any subsequent need for medication adjustment of medical intervention for : Neurological problems  [name] 11/18/2014, 8:33 AM                  Patient ID: Rubie Maid, female   DOB: Aug 19, 1934, 81 y.o.   MRN: [id number]

## 2014-12-01 NOTE — Progress Notes (Signed)
Social Work Patient ID: Brenda Blanchard, female   DOB: [birth year], 81 y.o.   MRN: [id number] Met with pt to discuss team conference progression toward her goals and contacted her sister as well.  Pt is looking forward to her son and granddaughter coming on Thurs and being here Friday for Family education.  She feels she is ready to go home since she has been here a while now.  Family is aware she will require 24 hr care upon discharge. Will schedule education for Friday at 10;00.

## 2014-12-01 NOTE — Progress Notes (Signed)
Subjective/Complaints: No issues overnite, pt enjoyed therapy yesterday Review of Systems - Negative except weak on R side Objective: Vital Signs: Blood pressure 156/58, pulse 51, temperature 98.1 F (36.7 C), temperature source Oral, resp. rate 17, height 5' 5"  (1.651 m), weight 92.761 kg (204 lb 8 oz), SpO2 98 %. No results found. Results for orders placed or performed during the hospital encounter of 11/16/14 (from the past 72 hour(s))  Creatinine, serum     Status: Abnormal   Collection Time: 11/30/14  5:57 AM  Result Value Ref Range   Creatinine, Ser 0.92 0.50 - 1.10 mg/dL   GFR calc non Af Amer 58 (L) >90 mL/min   GFR calc Af Amer 67 (L) >90 mL/min    Comment: (NOTE) The eGFR has been calculated using the CKD EPI equation. This calculation has not been validated in all clinical situations. eGFR's persistently <90 mL/min signify possible Chronic Kidney Disease.      HEENT: normal Cardio: RRR and no murmur Resp: CTA B/L and unlabored GI: BS positive and NT, ND Extremity:  Pulses positive and Edema mild Right hand, mild edema LLE>RLE, neg homan's, no consistent pain with ROM or palpation Skin:   Intact Neuro: Alert/Oriented, Cranial Nerve II-XII normal, Abnormal Sensory reduced R UE and RLE, Abnormal Motor 3+ to 4-/ 5 r Delt, bi, tri grip, HF, 4- R KE, 3- R ADF and Tone:  Within Normal Limits, good sitting balance Cerebellar Mild dysmetria right finger nose finger Musc/Skel:  Normal Gen NAD   Assessment/Plan: 1. 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  which require 3+ hours per day of interdisciplinary therapy in a comprehensive inpatient rehab setting. Physiatrist is providing close team supervision and 24 hour management of active medical problems listed below. Physiatrist and rehab team continue to assess barriers to discharge/monitor patient progress toward functional and medical goals. Team conference today please see physician  documentation under team conference tab, met with team face-to-face to discuss problems,progress, and goals. Formulized individual treatment plan based on medical history, underlying problem and comorbidities. FIM: FIM - Bathing Bathing Steps Patient Completed: Chest, Right Arm, Left Arm, Abdomen, Front perineal area, Buttocks, Left upper leg, Right upper leg, Right lower leg (including foot), Left lower leg (including foot) Bathing: 4: Steadying assist  FIM - Upper Body Dressing/Undressing Upper body dressing/undressing steps patient completed: Thread/unthread right sleeve of pullover shirt/dresss, Thread/unthread left sleeve of pullover shirt/dress, Put head through opening of pull over shirt/dress Upper body dressing/undressing: 5: Supervision: Safety issues/verbal cues FIM - Lower Body Dressing/Undressing Lower body dressing/undressing steps patient completed: Thread/unthread right pants leg, Thread/unthread left pants leg, Pull pants up/down Lower body dressing/undressing: 4: Steadying Assist  FIM - Toileting Toileting steps completed by patient: Adjust clothing prior to toileting, Performs perineal hygiene, Adjust clothing after toileting Toileting Assistive Devices: Grab bar or rail for support Toileting: 5: Supervision: Safety issues/verbal cues  FIM - Radio producer Devices: Grab bars Toilet Transfers: 4-To toilet/BSC: Min A (steadying Pt. > 75%)  FIM - Bed/Chair Transfer Bed/Chair Transfer Assistive Devices: Arm rests Bed/Chair Transfer: 4: Sit > Supine: Min A (steadying pt. > 75%/lift 1 leg)  FIM - Locomotion: Wheelchair Distance: 115 Locomotion: Wheelchair: 2: Travels 50 - 149 ft with supervision, cueing or coaxing FIM - Locomotion: Ambulation Locomotion: Ambulation Assistive Devices: Other (comment), Orthosis, Walker - Rolling (Ace bandage for R ankle dorsiflexion) Ambulation/Gait Assistance: 4: Min assist, 3: Mod assist Locomotion:  Ambulation: 2: Travels 50 -  149 ft with moderate assistance (Pt: 50 - 74%)  Comprehension Comprehension Mode: Auditory Comprehension: 6-Follows complex conversation/direction: With extra time/assistive device  Expression Expression Mode: Verbal Expression: 5-Expresses complex 90% of the time/cues < 10% of the time  Social Interaction Social Interaction: 6-Interacts appropriately with others with medication or extra time (anti-anxiety, antidepressant).  Problem Solving Problem Solving: 5-Solves basic 90% of the time/requires cueing < 10% of the time  Memory Memory: 6-More than reasonable amt of time  Medical Problem List and Plan: 1. 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  2. DVT Prophylaxis/Anticoagulation: Pharmaceutical: Lovenox,plt 281K 3. Pain Management: prob sensory dyseasthesias RLE 4. Mood: LCSW to follow for evaluation and support.  5. Neuropsych: This patient is capable of making decisions on her own behalf. 6. Skin/Wound Care: Routine pressure relief measures. Maintain adequate intake.  7. Fluids/Electrolytes/Nutrition: Document I/O. Push po fluids as poor output reported.  8. HTN: Non-compliance? --Improving On atenolol daily with clonidine .6m TID. Norvasc increased to 185m 9. Wheezing: patient noted to have SOB as well as bradycardia.   atenolol decreased to 25 mg daily.  -rhinocort 10. Urinary retention: improved   LOS (Days) 15 A FACE TO FACE EVALUATION WAS PERFORMED  KIRSTEINS,ANDREW E 12/01/2014, 6:59 AM

## 2014-12-01 NOTE — Progress Notes (Signed)
Recreational Therapy Session Note  Patient Details  Name: Brenda Blanchard MRN: [id number] Date of Birth: 07-Jul-1934 Today's Date: 12/01/2014  Pain: no c/o Skilled Therapeutic Interventions/Progress Updates: Session focused on discussing community reintegration/outing including purpose of outing & potential goals.  Pt agreeable to participate.  Therapy/Group: Individual Therapy   Reagan Klemz 12/01/2014, 4:24 PM

## 2014-12-01 NOTE — Progress Notes (Signed)
Orthopedic Tech Progress Note Patient Details:  Brenda Blanchard July 16, 1934 [id number]  Patient ID: Brenda Blanchard, female   DOB: [birth year], 80 y.o.   MRN: [id number] Called in advanced brace order; spoke with Jane Canary, Ryman Rathgeber 12/01/2014, 12:09 PM

## 2014-12-01 NOTE — Progress Notes (Signed)
Speech Language Pathology Daily Session Note  Patient Details  Name: Brenda Blanchard MRN: [id number] Date of Birth: 11-28-1933  Today's Date: 12/01/2014 SLP Individual Time: 0800-0900 SLP Individual Time Calculation (min): 60 min  Short Term Goals: Week 3: SLP Short Term Goal 1 (Week 3): Pt will improve carryover use of word finding strategies in conversations in 90% of observable opportunities wtih Mod I SLP Short Term Goal 2 (Week 3): Pt will improve complex functional problem solving with supervision level question cues to self-monitor and correct errors over 90% of observable opportunities.  SLP Short Term Goal 3 (Week 3): Pt will improve recall of daily information via use of compensatory aids, self-initiated in 90% of observable opportunities with mod I   Skilled Therapeutic Interventions:  Pt was seen for skilled ST targeting cognitive goals.  Upon arrival, pt was seated upright in wheelchair, awake, alert, and agreeable to participate in [place].  SLP facilitated the session with a structured grocery planning task targeting numerical reasoning, organization, planning, and error awareness.  Pt generated a weekly menu with three meal options and then created a corresponding shopping list with supervision.  Pt then located items in a flyer and recorded their prices with supervision.  Pt required min assist cues for organization to complete functional math calculations for 100% accuracy.  Continue per current plan of care.    FIM:  Comprehension Comprehension Mode: Auditory Comprehension: 6-Follows complex conversation/direction: With extra time/assistive device Expression Expression Mode: Verbal Expression: 5-Expresses complex 90% of the time/cues < 10% of the time Social Interaction Social Interaction: 6-Interacts appropriately with others with medication or extra time (anti-anxiety, antidepressant). Problem Solving Problem Solving: 5-Solves basic 90% of the time/requires cueing < 10% of the  time Memory Memory: 4-Recognizes or recalls 75 - 89% of the time/requires cueing 10 - 24% of the time  Pain Pain Assessment Pain Assessment: No/denies pain  Therapy/Group: Individual Therapy  Venida Tsukamoto, Selinda Orion 12/01/2014, 10:59 AM

## 2014-12-01 NOTE — Patient Care Conference (Signed)
Inpatient RehabilitationTeam Conference and Plan of Care Update Date: 12/01/2014   Time: 10;45 AM    Patient Name: Brenda Blanchard      Medical Record Number: [id number]  Date of Birth: 05-15-1934 Sex: Female         Room/Bed: 4M02C/4M02C-01 Payor Info: Payor: MEDICARE / Plan: MEDICARE PART A AND B / Product Type: *No Product type* /    Admitting Diagnosis: L ICH  Admit Date/Time:  11/16/2014  5:42 PM Admission Comments: No comment available   Primary Diagnosis:  ICH (intracerebral hemorrhage) Principal Problem: ICH (intracerebral hemorrhage)  Patient Active Problem List   Diagnosis Date Noted  . Numbness of face   . Wheezing without diagnosis of asthma   . Right hemiparesis 11/17/2014  . ICH (intracerebral hemorrhage) 11/16/2014  . Essential hypertension 11/16/2014  . Intracerebral hemorrhage of other cerebral location   . Stroke due to intracerebral hemorrhage 11/11/2014    Expected Discharge Date: Expected Discharge Date: 12/04/14  Team Members Present: Physician leading conference: Dr. Alysia Penna Social Worker Present: Ovidio Kin, LCSW Nurse Present: Heather Roberts, RN PT Present: Billie Ruddy, Renaye Rakers, PT OT Present: Willeen Cass, OT;Julia Saguier, Jules Schick, OT SLP Present: Windell Moulding, SLP     Current Status/Progress Goal Weekly Team Focus  Medical   Right upper extremity numbness and decreased functional usage  Home discharge  Discharge planning, family training   Bowel/Bladder   Continent of bowel and bladder. LBM 11/29/14  Continent of bowel and bladder  Monitor   Swallow/Nutrition/ Hydration     na        ADL's   min assist stand pivot transfers, min assist bathing and dressing.  Pt demonstrates Rt lean in standing requiring verbal and occasional tactile cues to correct  supervision overall, mod I grooming, UB dressing, and self-feeding  transfers, sit <> stand, standing balance, RUE NMR, home making tasks   Mobility   Min A basic transfers,  Mod A gait and stairs  Downgraded to supervision at w/c level, Min A ambulation and stairs  R NMR, safety awareness, attention to RUE/RLE, postural/gait stability, stair negotiation, D/C planning, family training   Communication   Mod I   Mod I, goal met       Safety/Cognition/ Behavioral Observations  supervision-min assist for recall and complex problem solving   supervision  continue to increase recall of complex information and emergent awareness of deficits   Pain   No c/o pain  <3  Assess for nonverbal cues of pain   Skin   CDI  CDI  Assess skin q shift, encourage routine turn q 2hrs      *See Care Plan and progress notes for long and short-term goals.  Barriers to Discharge: None anticipated for upcoming discharge except needs to complete caregiver training    Possible Resolutions to Barriers:  See above    Discharge Planning/Teaching Needs:  Son and granddaughter flying in [place] will be here Friday for family training and prepare for discharge Sat.  Aware pt requires 24 hr care      Team Discussion:  Making progress BP better-meds changes. Safety awareness flucuates.  Downgraded goals-to supervision/min wheelchair level.  Family education Friday 10;00 with son and granddaughter.  Revisions to Treatment Plan:  Downgraded goals-supervision/min-ambulation   Continued Need for Acute Rehabilitation Level of Care: The patient requires daily medical management by a physician with specialized training in physical medicine and rehabilitation for the following conditions: Daily direction of a multidisciplinary physical rehabilitation program  to ensure safe treatment while eliciting the highest outcome that is of practical value to the patient.: Yes Daily medical management of patient stability for increased activity during participation in an intensive rehabilitation regime.: Yes Daily analysis of laboratory values and/or radiology reports with any subsequent need for medication  adjustment of medical intervention for : Neurological problems  Stepfon Rawles, Gardiner Rhyme 12/01/2014, 2:35 PM

## 2014-12-01 NOTE — Progress Notes (Signed)
Physical Therapy Session Note  Patient Details  Name: Brenda Blanchard MRN: [id number] Date of Birth: [birth year]  Today's Date: 12/01/2014 PT Individual Time: 1120-1205 PT Individual Time Calculation (min): 45 min   Short Term Goals: Week 2:  PT Short Term Goal 1 (Week 2): STG's = LTG's secondary to anticipated LOS.  Skilled Therapeutic Interventions/Progress Updates:   Pt received seated in w/c; agreeable to therapy. Session focused on assessing/addressing pt appropriateness for R AFO, increasing selective control of RLE movement to improve gait stability, increase independence with functional mobility. Pt performed w/c mobility x55' in controlled environment with bilat LE's and supervision, increased time. Orthotist present for AFO assessment at this time. Therefore, transported pt remaining distance to hallway outside gym with total A for time management. While orthotist observing, pt performed gait x60' in controlled environment with rolling walker, R hand orthosis, and min A for stability; multimodal cueing focused on upright posture, bilat hip extension. Pt demonstrated the following gait deviations: trunk extension during RLE advancement (question if due to limited extensibility of hip flexors/iliopsoas vs decreased hip flexor strength), limited R hip/knee flexion, decreased R ankle dorsiflexion, limited bilat hip extension (limitation more prominent on R), and trunk flexion during RLE terminal stance. Orthotist, Gerald Stabs, to bring R AFO (posterior leaf spring as well as [place] Reaction) at future session.  Remainder of session focused on assessing/addressing gait impairments. Noted limited extensibility of R rectus femoris > R iliopsoas. Also noted pt difficulty relaxing R quadriceps despite max cueing, use of mirror for visual feedback. Performed contract-relax PNF alternating with prolonged passive stretch (2 x1-minute holds for rectus femoris, 3 x1-minute holds for iliopsoas) to increase muscle  extensibility. See below for NMR interventions focused on selective control of RLE. Session ended in pt room, where pt was left seated in w/c with lunch tray set up and all needs within reach.  Long term goals downgraded to supervision overall, with exception of Min A with ambulation and stair negotiation. Modified goal addressing stairs to simulate planned home entrance. Will follow up with pt/family and modify goal accordingly.  Therapy Documentation Precautions:  Precautions Precautions: Fall Precaution Comments: bil knee OA Restrictions Weight Bearing Restrictions: No Vital Signs: Therapy Vitals Temp: 98.1 F (36.7 C) Temp Source: Oral Pulse Rate: (!) 51 Resp: 17 BP: (!) 142/60 mmHg Patient Position (if appropriate): Lying Oxygen Therapy SpO2: 98 % O2 Device: Not Delivered Pain: Pain Assessment Pain Assessment: No/denies pain Mobility:   Locomotion : Ambulation Ambulation/Gait Assistance: 4: Min assist Wheelchair Mobility Distance: 55  NMR: Supine on mat table, performed RLE hip/knee flexion/extension AAROM with manual assist to increase selective control of R hip flexion with concurrent R knee flexion. Pt required increased time, tactile cueing at R hamstrings, hip flexors to initiate R hip flexion without holding R knee in rigid knee extension. Mirror utilized for Warden/ranger. Following NMR, noted pt able to actively perform R hip/knee flexion without hands-on assist to initiate supine > sit.  See FIM for current functional status  Therapy/Group: Individual Therapy  Stefano Gaul 12/01/2014, 12:35 PM

## 2014-12-01 NOTE — Progress Notes (Signed)
Occupational Therapy Session Note  Patient Details  Name: Brenda Blanchard MRN: [id number] Date of Birth: 1934-06-06  Today's Date: 12/01/2014 OT Individual Time: 0920-1005 and 1330-1400 OT Individual Time Calculation (min): 45 min and 30 min   Short Term Goals: Week 2:  OT Short Term Goal 1 (Week 2): Pt will complete toilet transfers with supervision at w/c level OT Short Term Goal 2 (Week 2): Pt will complete shower transfer with min assist from w/c level OT Short Term Goal 3 (Week 2): Pt will complete bathing with supervision OT Short Term Goal 4 (Week 2): Pt will complete LB dressing with supervision OT Short Term Goal 5 (Week 2): Pt will complete 2 grooming tasks in standing with min assist for standing balance and use of RUE as dominant  Skilled Therapeutic Interventions/Progress Updates:    1) Engaged in ADL retraining with focus on functional transfers, sit <> stand, dynamic standing balance, and functional use of RUE.  Bathing completed at sit > stand level in room shower with use of grab bars for steady assist when washing buttocks, pt required steady assist from therapist when attempting to wash perineal area with wide BOS.  Discussed d/c plan with pt reporting she would really like to stay here due to having MD and "process started here" while son would like for her to return with him.  Encouraged pt to keep team informed of d/c plan and reiterated recommendation of supervision at d/c.  Close supervision with donning pants, min/steady assist for underwear at sit > stand level.  Pt reports forgetting to use built up handle with meals yesterday, discussed possible benefits to assist with self-feeding.  Pt completed all oral care with Rt hand this session.  2) Engaged in therapeutic activity and NMR with focus on trunk control, weight shifting, and dynamic standing balance as required to complete LB bathing and dressing.  In therapy gym engaged in multiple sit > stands from mat with focus on  increased core activation without use of BUE with therapist in front of pt to simulate small space of walk-in shower.  Dynamic reaching with reaching outside BOS and behind self to simulate pulling up pants with pt requiring increased time when reaching behind secondary to decreased sensation in Rt hand and along entire Rt side waist down.  Contact guard during reaching activity.  Pt completed squat pivot on/off therapy mat with supervision.  Discussed shower seat vs tub bench with pt reporting also having tub/shower and might prefer tub bench.  Plan to educate on each transfer during next session.  Therapy Documentation Precautions:  Precautions Precautions: Fall Precaution Comments: bil knee OA Restrictions Weight Bearing Restrictions: No General:   Vital Signs: Therapy Vitals BP: (!) 142/60 mmHg Patient Position (if appropriate): Lying Pain:  Pt with no c/o pain  See FIM for current functional status  Therapy/Group: Individual Therapy  [name] 12/01/2014, 10:06 AM

## 2014-12-02 ENCOUNTER — Inpatient Hospital Stay (HOSPITAL_COMMUNITY): Payer: Medicare Other | Admitting: Speech Pathology

## 2014-12-02 ENCOUNTER — Encounter (HOSPITAL_COMMUNITY): Payer: Medicare Other | Admitting: Physical Therapy

## 2014-12-02 ENCOUNTER — Inpatient Hospital Stay (HOSPITAL_COMMUNITY): Payer: Medicare Other | Admitting: Occupational Therapy

## 2014-12-02 NOTE — Progress Notes (Signed)
Speech Language Pathology Daily Session Note  Patient Details  Name: Brenda Blanchard MRN: [id number] Date of Birth: 07-04-1934  Today's Date: 12/02/2014 SLP Individual Time: 1000-1030 SLP Individual Time Calculation (min): 30 min  Short Term Goals: Week 3: SLP Short Term Goal 1 (Week 3): Pt will improve carryover use of word finding strategies in conversations in 90% of observable opportunities wtih Mod I SLP Short Term Goal 2 (Week 3): Pt will improve complex functional problem solving with supervision level question cues to self-monitor and correct errors over 90% of observable opportunities.  SLP Short Term Goal 3 (Week 3): Pt will improve recall of daily information via use of compensatory aids, self-initiated in 90% of observable opportunities with mod I   Skilled Therapeutic Interventions:  Pt was seen for skilled ST targeting cognitive goals.  Upon arrival, pt was seated upright in wheelchair, awake, alert, and agreeable to participate in [place].  Pt reported that she was excited about her upcoming lunch outing and SLP facilitated the session with functional conversation targeting emergent/anticipatory awareness of deficits.  Pt required overall min-mod assist question cues to identify potential obstacles/challenges that could arise as a result of her cognitive and/or physical deficits during today's outing.   Pt then generated at least 1 solution to the abovementioned challenges with min question cues.  Continue per current plan of care.    FIM:  Comprehension Comprehension Mode: Auditory Comprehension: 6-Follows complex conversation/direction: With extra time/assistive device Expression Expression Mode: Verbal Expression: 6-Expresses complex ideas: With extra time/assistive device Social Interaction Social Interaction: 6-Interacts appropriately with others with medication or extra time (anti-anxiety, antidepressant). Problem Solving Problem Solving: 5-Solves basic 90% of the time/requires  cueing < 10% of the time Memory Memory: 4-Recognizes or recalls 75 - 89% of the time/requires cueing 10 - 24% of the time  Pain Pain Assessment Pain Assessment: No/denies pain  Therapy/Group: Individual Therapy  Luwanda Starr, Selinda Orion 12/02/2014, 1:38 PM

## 2014-12-02 NOTE — Progress Notes (Signed)
Recreational Therapy Discharge Summary Patient Details  Name: Brenda Blanchard MRN: [id number] Date of Birth: 06/15/1934 Today's Date: 12/02/2014  Long term goals set: 2  Long term goals met: 1  Comments on progress toward goals: Pt has made good progress toward goal and is Mod I for simple familiar TR tasks seated.  Pt does require extra time to complete tasks as well.  Pt plans to discharge home with family to provide 24 hour assistance. Reasons goals not met: Pt did not meet min assist community mobility goal as pt required mod assist for w/c mobility & ambulation using RW.  Reasons for discharge: discharge from hospital  Patient/family agrees with progress made and goals achieved: Yes  Brenda Blanchard 12/02/2014, 3:48 PM

## 2014-12-02 NOTE — Plan of Care (Signed)
Problem: RH Balance Goal: LTG Patient will maintain dynamic standing balance (PT) LTG: Patient will maintain dynamic standing balance with assistance during mobility activities (PT)  Downgraded secondary to slow pt progress.  Problem: RH Bed Mobility Goal: LTG Patient will perform bed mobility with assist (PT) LTG: Patient will perform bed mobility with assistance, with/without cues (PT).  Downgraded secondary to slow pt progress.  Problem: RH Bed to Chair Transfers Goal: LTG Patient will perform bed/chair transfers w/assist (PT) LTG: Patient will perform bed/chair transfers with assistance, with/without cues (PT).  Downgraded secondary to slow pt progress.  Problem: RH Car Transfers Goal: LTG Patient will perform car transfers with assist (PT) LTG: Patient will perform car transfers with assistance (PT).  Downgraded secondary to slow pt progress.  Problem: RH Furniture Transfers Goal: LTG Patient will perform furniture transfers w/assist (OT/PT LTG: Patient will perform furniture transfers with assistance (OT/PT).  Downgraded secondary to slow pt progress.  Problem: RH Ambulation Goal: LTG Patient will ambulate in controlled environment (PT) LTG: Patient will ambulate in a controlled environment, # of feet with assistance (PT).  Downgraded secondary to slow pt progress. Goal: LTG Patient will ambulate in home environment (PT) LTG: Patient will ambulate in home environment, # of feet with assistance (PT).  Downgraded secondary to slow pt progress.  Problem: RH Wheelchair Mobility Goal: LTG Patient will propel w/c in controlled environment (PT) LTG: Patient will propel wheelchair in controlled environment, # of feet with assist (PT)  Downgraded secondary to slow pt progress. Goal: LTG Patient will propel w/c in home environment (PT) LTG: Patient will propel wheelchair in home environment, # of feet with assistance (PT).  Downgraded secondary to slow pt progress. Goal: LTG  Patient will propel w/c in community environment (PT) LTG: Patient will propel wheelchair in community environment, # of feet with assist (PT)  Downgraded secondary to slow pt progress.

## 2014-12-02 NOTE — Progress Notes (Signed)
Physical Therapy Session Note  Patient Details  Name: Brenda Blanchard MRN: [id number] Date of Birth: [birth year]  Today's Date: 12/02/2014 PT Individual Time: 1110-1253 PT Individual Time Calculation (min): 103 min   Short Term Goals: Week 2:  PT Short Term Goal 1 (Week 2): STG's = LTG's secondary to anticipated LOS.  Skilled Therapeutic Interventions/Progress Updates:    Pt participated in outing with recreational therapist focusing on community integration, functional mobility in community environment, problem solving, and anticipatory awareness. Education focused on identifying/managing community obstacles and energy conservation. See paper outing assessment for further details. Pt required mod A for w/c mobility x50' in community environment (incline/decline) with bilat UE/LE's. Pt required mod A, 50% cueing for ambulation x50' in community environment with rolling walker, R hand orthosis, and R AFO (Reaction). Pt with improved insight, increased safety awareness, as exhibited by pt requesting to utilize w/c for community mobility. Pt also expressed considering moving in with son, who lives in [place].  Long term goals downgraded to Min A overall, with exception of Mod A ambulation.   Therapy Documentation Precautions:  Precautions Precautions: Fall Precaution Comments: bil knee OA Restrictions Weight Bearing Restrictions: No Vital Signs: Therapy Vitals Temp: 98.7 F (37.1 C) Temp Source: Oral Pulse Rate: (!) 51 Resp: 17 BP: (!) 138/58 mmHg Patient Position (if appropriate): Sitting Oxygen Therapy SpO2: 97 % O2 Device: Not Delivered Pain: Pain Assessment Pain Assessment: No/denies pain Locomotion : Ambulation Ambulation/Gait Assistance: 3: Mod assist Wheelchair Mobility Distance: 50   See FIM for current functional status  Therapy/Group: Co-Treatment  [place], Blair A 12/02/2014, 4:04 PM

## 2014-12-02 NOTE — Progress Notes (Signed)
Subjective/Complaints: "are you meeting with my son? My feet are swollenReview of Systems - Negative except weak on R side Objective: Vital Signs: Blood pressure 142/48, pulse 57, temperature 98.9 F (37.2 C), temperature source Oral, resp. rate 18, height 5' 5"  (1.651 m), weight 92.761 kg (204 lb 8 oz), SpO2 96 %. No results found. Results for orders placed or performed during the hospital encounter of 11/16/14 (from the past 72 hour(s))  Creatinine, serum     Status: Abnormal   Collection Time: 11/30/14  5:57 AM  Result Value Ref Range   Creatinine, Ser 0.92 0.50 - 1.10 mg/dL   GFR calc non Af Amer 58 (L) >90 mL/min   GFR calc Af Amer 67 (L) >90 mL/min    Comment: (NOTE) The eGFR has been calculated using the CKD EPI equation. This calculation has not been validated in all clinical situations. eGFR's persistently <90 mL/min signify possible Chronic Kidney Disease.      HEENT: normal Cardio: RRR and no murmur Resp: CTA B/L and unlabored GI: BS positive and NT, ND Extremity:  Pulses positive and Edema mild Right hand, mild edema RLE>LLE, neg homan's, no consistent pain with ROM or palpation Skin:   Intact Neuro: Alert/Oriented, Cranial Nerve II-XII normal, Abnormal Sensory reduced R UE and RLE, Abnormal Motor 3+ to 4-/ 5 r Delt, bi, tri grip, HF, 4- R KE, 3- R ADF and Tone:  Within Normal Limits,Musc/Skel:  Normal Gen NAD   Assessment/Plan: 1. 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  which require 3+ hours per day of interdisciplinary therapy in a comprehensive inpatient rehab setting. Physiatrist is providing close team supervision and 24 hour management of active medical problems listed below. Physiatrist and rehab team continue to assess barriers to discharge/monitor patient progress toward functional and medical goals.  FIM: FIM - Bathing Bathing Steps Patient Completed: Chest, Right Arm, Left Arm, Abdomen, Front perineal area,  Buttocks, Left upper leg, Right upper leg, Right lower leg (including foot), Left lower leg (including foot) Bathing: 4: Steadying assist  FIM - Upper Body Dressing/Undressing Upper body dressing/undressing steps patient completed: Thread/unthread right sleeve of pullover shirt/dresss, Thread/unthread left sleeve of pullover shirt/dress, Put head through opening of pull over shirt/dress, Thread/unthread right bra strap, Thread/unthread left bra strap, Hook/unhook bra, Pull shirt over trunk Upper body dressing/undressing: 5: Supervision: Safety issues/verbal cues FIM - Lower Body Dressing/Undressing Lower body dressing/undressing steps patient completed: Thread/unthread right underwear leg, Thread/unthread left underwear leg, Pull underwear up/down, Thread/unthread right pants leg, Thread/unthread left pants leg, Pull pants up/down, Don/Doff right shoe, Don/Doff left shoe, Fasten/unfasten right shoe, Fasten/unfasten left shoe Lower body dressing/undressing: 4: Steadying Assist  FIM - Toileting Toileting steps completed by patient: Adjust clothing prior to toileting, Performs perineal hygiene, Adjust clothing after toileting Toileting Assistive Devices: Grab bar or rail for support Toileting: 5: Supervision: Safety issues/verbal cues  FIM - Radio producer Devices: Grab bars Toilet Transfers: 4-To toilet/BSC: Min A (steadying Pt. > 75%)  FIM - Bed/Chair Transfer Bed/Chair Transfer Assistive Devices: Walker, Orthosis (R hand orthosis) Bed/Chair Transfer: 4: Sit > Supine: Min A (steadying pt. > 75%/lift 1 leg), 4: Supine > Sit: Min A (steadying Pt. > 75%/lift 1 leg), 4: Chair or W/C > Bed: Min A (steadying Pt. > 75%), 4: Bed > Chair or W/C: Min A (steadying Pt. > 75%)  FIM - Locomotion: Wheelchair Distance: 55 Locomotion: Wheelchair: 1: Travels less than 50 ft with supervision, cueing or  coaxing FIM - Locomotion: Ambulation Locomotion: Ambulation Assistive Devices:  Other (comment), Orthosis, Walker - Rolling Ambulation/Gait Assistance: 4: Min assist Locomotion: Ambulation: 2: Travels 50 - 149 ft with moderate assistance (Pt: 50 - 74%)  Comprehension Comprehension Mode: Auditory Comprehension: 6-Follows complex conversation/direction: With extra time/assistive device  Expression Expression Mode: Verbal Expression: 5-Expresses complex 90% of the time/cues < 10% of the time  Social Interaction Social Interaction: 6-Interacts appropriately with others with medication or extra time (anti-anxiety, antidepressant).  Problem Solving Problem Solving: 5-Solves basic 90% of the time/requires cueing < 10% of the time  Memory Memory: 4-Recognizes or recalls 75 - 89% of the time/requires cueing 10 - 24% of the time  Medical Problem List and Plan: 1. 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  2. DVT Prophylaxis/Anticoagulation: Pharmaceutical: Lovenox,plt 281K 3. Pain Management: prob sensory dyseasthesias RLE 4. Mood: LCSW to follow for evaluation and support.  5. Neuropsych: This patient is capable of making decisions on her own behalf. 6. Skin/Wound Care: Routine pressure relief measures. Maintain adequate intake.  7. Fluids/Electrolytes/Nutrition: Document I/O. Push po fluids as poor output reported.  8. HTN: Non-compliance? --Improving On atenolol daily with clonidine .1m TID. Norvasc increased to 110m 9. Wheezing: patient noted to have SOB as well as bradycardia.   atenolol decreased to 25 mg daily.  -rhinocort 10. Urinary retention: improved 11.  Mild peripheral edema mainly on hemiplegic side due to poor venous return, antiedema measures  LOS (Days) 16 A FACE TO FACE EVALUATION WAS PERFORMED  Janavia Rottman E 12/02/2014, 6:32 AM

## 2014-12-02 NOTE — Progress Notes (Signed)
Occupational Therapy Session Note  Patient Details  Name: Brenda Blanchard MRN: [id number] Date of Birth: [birth year]  Today's Date: 12/02/2014 OT Individual Time: 7290-2111 OT Individual Time Calculation (min): 60 min    Short Term Goals: Week 2:  OT Short Term Goal 1 (Week 2): Pt will complete toilet transfers with supervision at w/c level OT Short Term Goal 2 (Week 2): Pt will complete shower transfer with min assist from w/c level OT Short Term Goal 3 (Week 2): Pt will complete bathing with supervision OT Short Term Goal 4 (Week 2): Pt will complete LB dressing with supervision OT Short Term Goal 5 (Week 2): Pt will complete 2 grooming tasks in standing with min assist for standing balance and use of RUE as dominant  Skilled Therapeutic Interventions/Progress Updates:    Engaged in ADL retraining with focus on functional transfers, sit <> stand, dynamic standing balance, and functional use of RUE during self-care tasks.  Pt completed w/c > shower chair in room shower transfers with min/steady assist with pt locking brakes and completing setup with min cues only.  Discussed whether w/c will fit in bathroom, with pt planning to ask son to measure doorway today.   Close supervision during bathing and dressing at sit > stand level this session, with increased focus on trunk control with sit > stand and not relying on pulling up on grab bar or counter top.  Pt still requiring assist from grab bar with sit > stand and when washing perineal area in tight quarters of room shower.  Educated on tub/shower transfers with tub transfer bench with pt requiring min assist with initial transfer and supervision during second attempt.  Pt would benefit from tub transfer bench and grab bar in tub/shower at home.    Therapy Documentation Precautions:  Precautions Precautions: Fall Precaution Comments: bil knee OA Restrictions Weight Bearing Restrictions: No General:   Vital Signs: Therapy Vitals BP: (!)  150/54 mmHg Pain:  Pt with no c/o pain  See FIM for current functional status  Therapy/Group: Individual Therapy  [name] 12/02/2014, 10:38 AM

## 2014-12-02 NOTE — Progress Notes (Signed)
Recreational Therapy Session Note  Patient Details  Name: Brenda Blanchard MRN: [id number] Date of Birth: 02-28-1934 Today's Date: 12/02/2014  Pain: no c/o Skilled Therapeutic Interventions/Progress Updates:Pt participated in community reintegration/outing to [employer] w/c & ambulatory level.  Goals focused on community mobility, dynamic standing balance, RUE use, problem solving & negotiating obstacles, discussing how to access public restrooms, energy conservation techniques & discharge planning.  Pt participated at overall mod assist w/c level and mod assist for short distance ambulation using RW.  Pt stated outing was very helpful and appreciated community practice & education.  Therapy/Group: [employer]  Brenda Blanchard 12/02/2014, 3:16 PM

## 2014-12-03 ENCOUNTER — Inpatient Hospital Stay (HOSPITAL_COMMUNITY): Payer: Medicare Other | Admitting: Physical Therapy

## 2014-12-03 ENCOUNTER — Inpatient Hospital Stay (HOSPITAL_COMMUNITY): Payer: Medicare Other | Admitting: Speech Pathology

## 2014-12-03 ENCOUNTER — Inpatient Hospital Stay (HOSPITAL_COMMUNITY): Payer: Medicare Other | Admitting: Occupational Therapy

## 2014-12-03 MED ORDER — ACETAMINOPHEN 325 MG PO TABS: 325.0000 mg | ORAL_TABLET | ORAL | Status: AC | PRN

## 2014-12-03 MED ORDER — ATENOLOL 25 MG PO TABS: 25.0000 mg | ORAL_TABLET | Freq: Every day | ORAL | Status: AC

## 2014-12-03 MED ORDER — AMLODIPINE BESYLATE 10 MG PO TABS: 10.0000 mg | ORAL_TABLET | Freq: Every day | ORAL | Status: AC

## 2014-12-03 MED ORDER — FLUTICASONE PROPIONATE 50 MCG/ACT NA SUSP: 1.0000 | Freq: Every day | NASAL | Status: AC

## 2014-12-03 MED ORDER — CLONIDINE HCL 0.3 MG PO TABS: 0.3000 mg | ORAL_TABLET | Freq: Three times a day (TID) | ORAL | Status: AC

## 2014-12-03 MED ORDER — PANTOPRAZOLE SODIUM 40 MG PO TBEC: 40.0000 mg | DELAYED_RELEASE_TABLET | Freq: Every day | ORAL | Status: AC | PRN

## 2014-12-03 NOTE — Progress Notes (Signed)
Physical Therapy Discharge Summary  Patient Details  Name: Brenda Blanchard MRN: [id number] Date of Birth: 12-02-1933  Patient has met 12 of 12 long term goals due to improved activity tolerance, improved balance, improved postural control, functional use of  right lower extremity, improved attention and improved awareness.  Patient to discharge at an ambulatory level [place].   Patient's care partner is independent to provide the necessary physical and cognitive assistance at discharge.  Reasons goals not met: N/A; all goals met.  Recommendation:  Patient will benefit from ongoing skilled PT services in home health setting to continue to advance safe functional mobility, address ongoing impairments in attention to RUE/LE during functional activities/mobility, increase stability/independence with functional mobility, and minimize fall risk.  Equipment: w/c (18" x 18" hemi height) and cushion; rolling walker, R hand orthosis, R ankle-foot orthotic  Reasons for discharge: treatment goals met and discharge from hospital  Patient/family agrees with progress made and goals achieved: Yes  PT Discharge Precautions/Restrictions Precautions Precautions: Fall Precaution Comments: bil knee OA Restrictions Weight Bearing Restrictions: No Vital Signs Therapy Vitals Temp: 98.8 F (37.1 C) Temp Source: Oral Pulse Rate: (!) 51 Resp: 16 BP: (!) 143/49 mmHg Patient Position (if appropriate): Lying Oxygen Therapy SpO2: 99 % O2 Device: Not Delivered Pain Pain Assessment Pain Assessment: No/denies pain Vision/Perception  Vision - Assessment Eye Alignment: Within Functional Limits Ocular Range of Motion: Within Functional Limits  Cognition Overall Cognitive Status: Impaired/Different from baseline Arousal/Alertness: Awake/alert Orientation Level: Oriented X4 Attention: Selective Selective Attention: Appears intact Memory: Impaired Memory Impairment: Storage deficit;Retrieval  deficit;Decreased recall of new information Awareness: Impaired Awareness Impairment: Anticipatory impairment Problem Solving: Impaired Problem Solving Impairment: Functional complex Executive Function: Self Monitoring;Self Correcting Self Monitoring: Impaired Self Monitoring Impairment: Functional complex Self Correcting: Impaired Self Correcting Impairment: Functional complex Safety/Judgment: Appears intact Sensation Sensation Light Touch: Impaired Detail Light Touch Impaired Details: Absent RLE;Impaired RUE (some sensation in Rt thumb and first digit) Stereognosis: Not tested Hot/Cold: Not tested Proprioception: Impaired Detail Proprioception Impaired Details: Absent RLE;Absent RUE Additional Comments: Pt reports no sensation in RLE and diminished sensation in RUE. Pt unable to localize light touch in RUE except in Rt thumb and first digit Coordination Gross Motor Movements are Fluid and Coordinated: No Fine Motor Movements are Fluid and Coordinated: No Finger Nose Finger Test: Rt: 6x in 10 seconds with mild discoordination, Lt: 8x in 10 seconds Heel Shin Test: Limited excrusion and decreased smoothness of movement in RLE as compared with LLE. Motor  Motor Motor: Hemiplegia;Abnormal postural alignment and control  Mobility Bed Mobility Bed Mobility: Supine to Sit Supine to Sit: 5: Supervision;HOB flat Sit to Supine: 5: Supervision;HOB flat Transfers Transfers: Yes Sit to Stand: 5: Supervision;From chair/3-in-1;Other/comment (Pt able to consistently perform sit > stand with supervision if pt provided with cues for setup, RLE positoining) Sit to Stand Details: Verbal cues for technique;Tactile cues for weight shifting Stand to Sit: 5: Supervision;4: Min guard Stand to Sit Details (indicate cue type and reason): Verbal cues for precautions/safety Stand Pivot Transfers: 4: Min assist Stand Pivot Transfer Details: Verbal cues for precautions/safety;Tactile cues for weight  shifting;Verbal cues for technique Locomotion  Ambulation Ambulation: Yes Ambulation/Gait Assistance: 4: Min assist;3: Mod assist Ambulation Distance (Feet): 160 Feet Assistive device: Rolling walker;Other (Comment) (R hand orthosis) Ambulation/Gait Assistance Details: Tactile cues for weight shifting;Tactile cues for posture;Verbal cues for precautions/safety;Verbal cues for safe use of DME/AE Gait Gait: Yes Gait Pattern: Impaired Gait Pattern: Decreased step length - right;Decreased stance time - right;Poor  foot clearance - right;Trunk flexed;Decreased trunk rotation;Step-through pattern;Decreased dorsiflexion - right;Decreased hip/knee flexion - right High Level Ambulation High Level Ambulation: Side stepping Side Stepping: with rolling walker and mod A Stairs / Additional Locomotion Stairs: Yes Stairs Assistance: 4: Min assist Stair Management Technique: Two rails;Forwards;Step to pattern Number of Stairs: 12 Height of Stairs: 6.5 Ramp: 4: Min assist;Other (comment) (using rolling walker) Wheelchair Mobility Wheelchair Mobility: Yes Wheelchair Assistance: 5: Supervision Wheelchair Assistance Details: Other (comment);Verbal cues for precautions/safety (Cueing for attention to RLE, R visual field) Wheelchair Propulsion: Both upper extremities;Both lower extermities Wheelchair Parts Management: Needs assistance Distance: 75  Trunk/Postural Assessment  Cervical Assessment Cervical Assessment: Within Functional Limits Thoracic Assessment Thoracic Assessment: Within Functional Limits Lumbar Assessment Lumbar Assessment: Within Functional Limits Postural Control Postural Control: Deficits on evaluation Righting Reactions: Hip strategy impaired with anterior LOB, ankle strategy impaired on R, and stepping strategy inconsistent with LOB in all directions.  Balance Balance Balance Assessed: Yes Dynamic Sitting Balance Dynamic Sitting - Balance Support: Feet supported;No upper  extremity supported;During functional activity Dynamic Sitting - Level of Assistance: 5: Stand by assistance Dynamic Standing Balance Dynamic Standing - Balance Support: During functional activity;Bilateral upper extremity supported Dynamic Standing - Level of Assistance: 4: Min assist Extremity Assessment  RUE Assessment RUE Assessment: Exceptions to [facility] RUE AROM (degrees) RUE Overall AROM Comments: shoulder approx 100 degrees flexion (due to h/o broken arm), elbow WFL, wrist WFL, finger flexion/extension WFL RUE Strength RUE Overall Strength Comments: grossly 3-/5 shoulder and ticeps, 3+/5 biceps, loose gross grasp LUE Assessment LUE Assessment: Within Functional Limits RLE Assessment RLE Assessment: Exceptions to [facility] RLE Strength RLE Overall Strength: Deficits RLE Overall Strength Comments: Grossly 4-/5 in R hip, knee; 3-/5 R ankle dorsiflexion, and 4/5 plantarflexion LLE Assessment LLE Assessment: Within Functional Limits  See FIM for current functional status  [place], Malva Cogan 12/03/2014, 7:18 PM

## 2014-12-03 NOTE — Progress Notes (Signed)
Speech Language Pathology Discharge Summary  Patient Details  Name: Brenda Blanchard MRN: [id number] Date of Birth: [birth year]  Today's Date: 12/03/2014 SLP Individual Time: 1330-1400 SLP Individual Time Calculation (min): 30 min   Skilled Therapeutic Interventions:  Pt was seen for skilled ST targeting family education prior to discharge.  Upon arrival, pt was seated upright in wheelchair with her son present.  SLP updated pt's son on pt's current goals and progress in therapy.  SLP initiated skilled education regarding compensatory strategies for cognition, including use of memory compensatory aids and executive function strategies.  SLP provided handouts to facilitate carryover in the home environment.  SLP strongly recommended that pt have assistance with medication and financial management and 24/7 due to cognitive deficits.  Pt and pt's son were in agreement with the abovementioned recommendations and verbalized understanding of education.  All pt's and family's questions were answered to their satisfaction at this time.      Patient has met 3 of 3 long term goals.  Patient to discharge at overall Supervision level.  Reasons goals not met: n/a   Clinical Impression/Discharge Summary:  Pt made functional gains while inpatient and is discharging having met 3 out of 3 long term goals.  Pt currently requires supervision for basic cognitive tasks and min assist for semi-complex cognitive tasks due to decreased self monitoring and correction of errors, poor recall of new information, as well as decreased anticipatory awareness of challenges/obstacles that could arise as a result of her cognitive and physical deficits s/p CVA.  Pt is discharging home with 24/7 supervision and assistance for medication and financial management due to cognitive deficits.  SLP also recommend ST follow up at discharge to continue addressing cognitive remediation/compensation.  Pt and family education is complete at this time.     Care Partner:  Caregiver Able to Provide Assistance: Yes  Type of Caregiver Assistance: Physical;Cognitive  Recommendation:  24 hour supervision/assistance;Outpatient SLP;Home Health SLP  Rationale for SLP Follow Up: Maximize cognitive function and independence;Reduce caregiver burden   Equipment: none recommended by SLP    Reasons for discharge: Discharged from hospital   Patient/Family Agrees with Progress Made and Goals Achieved: Yes   See FIM for current functional status  PageSelinda Orion 12/03/2014, 3:50 PM

## 2014-12-03 NOTE — Progress Notes (Signed)
Occupational Therapy Discharge Summary  Patient Details  Name: Brenda Blanchard MRN: [id number] Date of Birth: 08/23/1934  Patient has met 80 of 68 long term goals due to improved activity tolerance, improved balance, postural control and functional use of  RIGHT upper and RIGHT lower extremity.  Patient to discharge at [facility] Assist level with transfers and supervision self-care tasks.  Patient's care partner is independent to provide the necessary physical and cognitive assistance at discharge.  Pt's son present for hands on family education with transfers at [facility] level and education regarding decreased sensation and proprioception in RUE/RLE.  Reasons goals not met: Pt requires min assist with toilet and tub/shower transfers with RW as pt's bathroom is not accessible via w/c.  Pt and pt's son return demonstrated safety with transfers at min assist level.  Recommendation:  Patient will benefit from ongoing skilled OT services in home health setting to continue to advance functional skills in the area of BADL, iADL and Reduce care partner burden.  Equipment: BSC and tub transfer bench  Reasons for discharge: treatment goals met and discharge from hospital  Patient/family agrees with progress made and goals achieved: Yes  OT Discharge Precautions/Restrictions  Precautions Precautions: Fall Precaution Comments: bil knee OA General   Vital Signs Therapy Vitals Temp: 98.8 F (37.1 C) Temp Source: Oral Pulse Rate: (!) 51 Resp: 16 BP: (!) 143/49 mmHg Patient Position (if appropriate): Lying Oxygen Therapy SpO2: 99 % O2 Device: Not Delivered Pain Pain Assessment Pain Assessment: No/denies pain ADL  See FIM Vision/Perception  Vision- History Baseline Vision/History: No visual deficits Wears Glasses: Reading only Patient Visual Report: No change from baseline Vision- Assessment Vision Assessment?: Yes Eye Alignment: Within Functional Limits Ocular Range of Motion: Within  Functional Limits Visual Fields: No apparent deficits  Cognition Overall Cognitive Status: Impaired/Different from baseline Arousal/Alertness: Awake/alert Orientation Level: Oriented X4 Attention: Selective Selective Attention: Appears intact Memory: Impaired Memory Impairment: Storage deficit;Retrieval deficit;Decreased recall of new information Awareness: Impaired Awareness Impairment: Anticipatory impairment Problem Solving: Impaired Problem Solving Impairment: Functional complex Safety/Judgment: Appears intact Sensation Sensation Light Touch: Impaired Detail Light Touch Impaired Details: Absent RLE;Impaired RUE (some sensation in Rt thumb and first digit) Stereognosis: Not tested Hot/Cold: Not tested Proprioception: Impaired Detail Proprioception Impaired Details: Absent RLE;Absent RUE Additional Comments: Pt reports no sensation in RLE and diminished sensation in RUE. Pt unable to localize light touch in RUE except in Rt thumb and first digit Coordination Gross Motor Movements are Fluid and Coordinated: No Fine Motor Movements are Fluid and Coordinated: No Finger Nose Finger Test: Rt: 6x in 10 seconds with mild discoordination, Lt: 8x in 10 seconds Extremity/Trunk Assessment RUE Assessment RUE Assessment: Exceptions to [facility] RUE AROM (degrees) RUE Overall AROM Comments: shoulder approx 100 degrees flexion (due to h/o broken arm), elbow WFL, wrist WFL, finger flexion/extension WFL RUE Strength RUE Overall Strength Comments: grossly 3-/5 shoulder and ticeps, 3+/5 biceps, loose gross grasp LUE Assessment LUE Assessment: Within Functional Limits  See FIM for current functional status  Christos Mixson, [facility] 12/03/2014, 3:54 PM

## 2014-12-03 NOTE — Discharge Instructions (Signed)
Inpatient Rehab Discharge Instructions  [facility] Discharge date and time: 12/04/14   Activities/Precautions/ Functional Status: Activity: activity as tolerated with supervision Diet: cardiac diet Wound Care: none needed   Functional status:  ___ No restrictions     ___ Walk up steps independently _X__ 24/7 supervision/assistance   ___ Walk up steps with assistance ___ Intermittent supervision/assistance  ___ Bathe/dress independently ___ Walk with walker     _X__ Bathe/dress with assistance ___ Walk Independently    ___ Shower independently ___ Walk with assistance    ___ Shower with assistance _X__ No alcohol     ___ Return to work/school ________  Special Instructions:     COMMUNITY REFERRALS UPON DISCHARGE:    Home Health:   PT, OT, SP, [place] FOYDX:[phone] Date of last service:12/03/2014  Medical Equipment/Items Ordered:WHEELCHAIR, [place]    [phone]   GENERAL COMMUNITY RESOURCES FOR PATIENT/FAMILY: Support Groups:[facility] SUPPORT GROUP   STROKE/TIA DISCHARGE INSTRUCTIONS SMOKING Cigarette smoking nearly doubles your risk of having a stroke & is the single most alterable risk factor  If you smoke or have smoked in the last 12 months, you are advised to quit smoking for your health.  Most of the excess cardiovascular risk related to smoking disappears within a year of stopping.  Ask you doctor about anti-smoking medications  [place] Quit Line: 1-800-QUIT NOW  Free Smoking Cessation Classes [phone]  CHOLESTEROL Know your levels; limit fat & cholesterol in your diet  Lipid Panel     Component Value Date/Time   CHOL 203* 11/12/2014 0655   TRIG 54 11/12/2014 0655   HDL 67 11/12/2014 0655   CHOLHDL 3.0 11/12/2014 0655   VLDL 11 11/12/2014 0655   LDLCALC 125* 11/12/2014 0655      Many patients benefit from treatment even if their cholesterol is at goal.  Goal: Total Cholesterol  (CHOL) less than 160  Goal:  Triglycerides (TRIG) less than 150  Goal:  HDL greater than 40  Goal:  LDL (LDLCALC) less than 100   BLOOD PRESSURE American Stroke Association blood pressure target is less that 120/80 mm/Hg  Your discharge blood pressure is:  BP: (!) 150/52 mmHg  Monitor your blood pressure  Limit your salt and alcohol intake  Many individuals will require more than one medication for high blood pressure  DIABETES (A1c is a blood sugar average for last 3 months) Goal HGBA1c is under 7% (HBGA1c is blood sugar average for last 3 months)  Diabetes:     Lab Results  Component Value Date   HGBA1C 6.6* 11/12/2014     Your HGBA1c can be lowered with medications, healthy diet, and exercise.  Check your blood sugar as directed by your physician  Call your physician if you experience unexplained or low blood sugars.  PHYSICAL ACTIVITY/REHABILITATION Goal is 30 minutes at least 4 days per week  Activity: No restrictions. Therapies: See above Return to work: N/A  Activity decreases your risk of heart attack and stroke and makes your heart stronger.  It helps control your weight and blood pressure; helps you relax and can improve your mood.  Participate in a regular exercise program.  Talk with your doctor about the best form of exercise for you (dancing, walking, swimming, cycling).  DIET/WEIGHT Goal is to maintain a healthy weight  Your discharge diet is: Diet Heart thin liquids Your height is:  Height: 5\' 5"  (165.1 cm) Your current weight is: Weight: 92.761  kg (204 lb 8 oz) Your Body Mass Index (BMI) is:    Following the type of diet specifically designed for you will help prevent another stroke.  Your goal weight is:  150 lbs  Your goal Body Mass Index (BMI) is 19-24.  Healthy food habits can help reduce 3 risk factors for stroke:  High cholesterol, hypertension, and excess weight.  RESOURCES Stroke/Support Group:  Call [phone]   STROKE EDUCATION  PROVIDED/REVIEWED AND GIVEN TO PATIENT Stroke warning signs and symptoms How to activate emergency medical system (call 911). Medications prescribed at discharge. Need for follow-up after discharge. Personal risk factors for stroke. Pneumonia vaccine given:  Flu vaccine given:  My questions have been answered, the writing is legible, and I understand these instructions.  I will adhere to these goals & educational materials that have been provided to me after my discharge from the hospital.       My questions have been answered and I understand these instructions. I will adhere to these goals and the provided educational materials after my discharge from the hospital.  Patient/Caregiver Signature _______________________________ Date __________  Clinician Signature _______________________________________ Date __________  Please bring this form and your medication list with you to all your follow-up doctor's appointments.

## 2014-12-03 NOTE — Progress Notes (Signed)
Physical Therapy Session Note  Patient Details  Name: Brenda Blanchard MRN: [id number] Date of Birth: 1934-06-13  Today's Date: 12/03/2014 PT Individual Time: 1000-1115 and 1445-1500 PT Individual Time Calculation (min): 75 min and 15 min  Short Term Goals: Week 2:  PT Short Term Goal 1 (Week 2): STG's = LTG's secondary to anticipated LOS.  Skilled Therapeutic Interventions/Progress Updates:    Treatment Session 1:: Pt received seated in w/c accompanied by son, [name]. Pt agreeable to therapy. Session focused on hands-on family training with son, who will be pt's primary caregiver at D/C. Therapist explained, demonstrated technique for safe assistance as well as appropriate cueing with all of the following: stand pivot transfers from bed <> w/c <> couch with min A, cueing for setup, full anterior weight shift; stand pivot transfers from w/c<>simulated car with min A and cueing for RLE positioning, safe proximity to car, and hand placement; gait x80' in controlled and home environments with rolling walker, R hand orthosis, and R AFO with min to mod A, cueing for safe gait speed; providing supervision, subtle to min cueing for w/c mobility x75' with bilat UE/LE's in controlled and home environments with supervision. Son gave effective return demonstration of safe assistance and appropriate cueing with all described aspects of functional mobility.  Per pt/son, plan is for pt to D/C home in [place] until MD gives medical clearance for pt to fly to [place] to live with son. Therefore, pt will need to negotiate 2 stairs without rails to enter home. Son planning on having rail installed as soon as possible but will be unable to do so before D/C home tomorrow. Therefore, PT instructed son in technique for providing +2A (to simulate assist of niece, who will also be staying in [place] with pt) for pt to negotiate 2 stairs without rails. Per son, width of stairs will not allow bilat HHA of two persons (each  providing Min A); therefore, pt negotiated stairs with therapist positioned anterior to/facing pt providing bilat HHA while son was behind pt providing stability at hips. Therapist and son performed 2 trials providing assist in each position (total of 8 stairs). Son expressed feeling safe/comfortable performing and teaching niece to assist with stair negotiation technique.  PT educated pt and son on assistance required with all aspects of functional mobility upon discharge home. Emphasis given to strong recommendation that pt will require 24/7 supervision and that pt is at very high risk of falling with standing and ambulation. Pt and son verbalized understanding and were both in full agreement. Pt and son verbalized feeling safe and comfortable performing/assisting with all aspects of functional mobility at discharge. Orthotist present to assess gait using R AFO (Ottobock Reaction). Pt agreeable to PT recommendation for leather toe cap to address R toe drag during RLE advancement; however, pt expressing desire to purchase new shoes. Pt to follow up with orthotist post-D/C if pt elects to pursue toe cap.   Treatment Session 2: Brief session focused on completion of discharge evaluation, completion of education with son. Transported pt to gym in w/c, where pt negotiated 12 stairs with bilat rails, forward-facing with step-to pattern and min A, cueing for sequencing, technique and RUE advancement on rail. Explained, demonstrated w/c parts management and w/c breakdown; son with effective return demonstration. Session ended in pt room, where pt was left seated in w/c with son present and all needs within reach.  Therapy Documentation Precautions:  Precautions Precautions: Fall Precaution Comments: bil knee OA Restrictions Weight Bearing Restrictions: No Vital  Signs: Therapy Vitals BP: (!) 142/70 mmHg Pain: Pain Assessment Pain Assessment: No/denies pain Locomotion : Ambulation Ambulation/Gait  Assistance: 4: Min assist;3: Mod assist Wheelchair Mobility Distance: 75   See FIM for current functional status  Therapy/Group: Individual Therapy  Tammatha Cobb, Malva Cogan 12/03/2014, 1:11 PM

## 2014-12-03 NOTE — Progress Notes (Signed)
Subjective/Complaints:  My feet are swollenReview of Systems - Negative except weak on R side Objective: Vital Signs: Blood pressure 150/52, pulse 56, temperature 98.1 F (36.7 C), temperature source Oral, resp. rate 18, height 5\' 5"  (1.651 m), weight 92.761 kg (204 lb 8 oz), SpO2 98 %. No results found. No results found for this or any previous visit (from the past 72 hour(s)).   HEENT: normal Cardio: RRR and no murmur Resp: CTA B/L and unlabored GI: BS positive and NT, ND Extremity:  Pulses positive and Edema mild Right hand, mild edema RLE>LLE, neg homan's, no consistent pain with ROM or palpation Skin:   Intact Neuro: Alert/Oriented, Cranial Nerve II-XII normal, Abnormal Sensory reduced R UE and RLE, Abnormal Motor 3+ to 4-/ 5 r Delt, bi, tri grip, HF, 4- R KE, 3- R ADF and Tone:  Within Normal Limits,Musc/Skel:  Normal Gen NAD   Assessment/Plan: 1. 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  which require 3+ hours per day of interdisciplinary therapy in a comprehensive inpatient rehab setting. Physiatrist is providing close team supervision and 24 hour management of active medical problems listed below. Physiatrist and rehab team continue to assess barriers to discharge/monitor patient progress toward functional and medical goals. Ready for d/c in am States her PCP is in [place], needs local PCP Needs neuro f/u FIM: FIM - Bathing Bathing Steps Patient Completed: Chest, Right Arm, Left Arm, Abdomen, Front perineal area, Buttocks, Left upper leg, Right upper leg, Right lower leg (including foot), Left lower leg (including foot) Bathing: 5: Supervision: Safety issues/verbal cues  FIM - Upper Body Dressing/Undressing Upper body dressing/undressing steps patient completed: Thread/unthread right sleeve of pullover shirt/dresss, Thread/unthread left sleeve of pullover shirt/dress, Put head through opening of pull over shirt/dress, Thread/unthread  right bra strap, Thread/unthread left bra strap, Hook/unhook bra, Pull shirt over trunk Upper body dressing/undressing: 6: More than reasonable amount of time FIM - Lower Body Dressing/Undressing Lower body dressing/undressing steps patient completed: Thread/unthread right underwear leg, Thread/unthread left underwear leg, Pull underwear up/down, Thread/unthread right pants leg, Thread/unthread left pants leg, Pull pants up/down, Don/Doff right shoe, Don/Doff left shoe, Fasten/unfasten right shoe, Fasten/unfasten left shoe Lower body dressing/undressing: 5: Set-up assist to: Don/Doff TED stocking  FIM - Toileting Toileting steps completed by patient: Adjust clothing prior to toileting, Performs perineal hygiene, Adjust clothing after toileting Toileting Assistive Devices: Grab bar or rail for support Toileting: 5: Supervision: Safety issues/verbal cues  FIM - Radio producer Devices: Grab bars Toilet Transfers: 4-To toilet/BSC: Min A (steadying Pt. > 75%)  FIM - Control and instrumentation engineer Devices: Walker, Orthosis (R hand orthosis) Bed/Chair Transfer: 4: Chair or W/C > Bed: Min A (steadying Pt. > 75%), 4: Bed > Chair or W/C: Min A (steadying Pt. > 75%)  FIM - Locomotion: Wheelchair Distance: 50 Locomotion: Wheelchair: 2: Travels 50 - 149 ft with moderate assistance (Pt: 50 - 74%) FIM - Locomotion: Ambulation Locomotion: Ambulation Assistive Devices: Other (comment), Orthosis, Walker - Rolling Ambulation/Gait Assistance: 3: Mod assist Locomotion: Ambulation: 2: Travels 50 - 149 ft with moderate assistance (Pt: 50 - 74%)  Comprehension Comprehension Mode: Auditory Comprehension: 6-Follows complex conversation/direction: With extra time/assistive device  Expression Expression Mode: Verbal Expression: 6-Expresses complex ideas: With extra time/assistive device  Social Interaction Social Interaction: 6-Interacts appropriately with others  with medication or extra time (anti-anxiety, antidepressant).  Problem Solving Problem Solving: 5-Solves basic 90% of the time/requires cueing < 10% of the time  Memory Memory: 4-Recognizes or recalls 75 - 89% of the time/requires cueing 10 - 24% of the time  Medical Problem List and Plan: 1. 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  2. DVT Prophylaxis/Anticoagulation: Pharmaceutical:D/C Lovenox,amb better      3. Pain Management: prob sensory dyseasthesias RLE 4. Mood: LCSW to follow for evaluation and support.  5. Neuropsych: This patient is capable of making decisions on her own behalf. 6. Skin/Wound Care: Routine pressure relief measures. Maintain adequate intake.  7. Fluids/Electrolytes/Nutrition: Document I/O. Push po fluids as poor output reported.  8. HTN: Non-compliance? --Improving On atenolol daily with clonidine .3mg  TID. Norvasc increased to 10mg   9. Wheezing: patient noted to have SOB as well as bradycardia.   atenolol decreased to 25 mg daily.  -rhinocort 10. Urinary retention: improved 11.  Mild peripheral edema mainly on hemiplegic side due to poor venous return, antiedema measures  LOS (Days) 17 A FACE TO FACE EVALUATION WAS PERFORMED  Tamana Hatfield E 12/03/2014, 6:19 AM

## 2014-12-03 NOTE — Progress Notes (Signed)
Occupational Therapy Session Note  Patient Details  Name: Brenda Blanchard MRN: [id number] Date of Birth: 09/29/1934  Today's Date: 12/03/2014 OT Individual Time: 6010-9323 and 1410-1440 OT Individual Time Calculation (min): 60 min and 30 min   Short Term Goals: Week 2:  OT Short Term Goal 1 (Week 2): Pt will complete toilet transfers with supervision at w/c level OT Short Term Goal 2 (Week 2): Pt will complete shower transfer with min assist from w/c level OT Short Term Goal 3 (Week 2): Pt will complete bathing with supervision OT Short Term Goal 4 (Week 2): Pt will complete LB dressing with supervision OT Short Term Goal 5 (Week 2): Pt will complete 2 grooming tasks in standing with min assist for standing balance and use of RUE as dominant  Skilled Therapeutic Interventions/Progress Updates:   1) Engaged in hands on family education with pt and pt's son with focus on functional transfers, attention to RUE and RLE, and safety with mobility and self-care tasks.  Pt received from PT, engaged in education with toilet transfer and tub/shower transfer with use of tub bench.  Pt must sidestep through doorway of bathroom as RW will not fit forward, educated on side stepping and transfer technique with tub transfer bench with pt and son return demonstrating.  Pt and son completed toilet transfers with RW and report competence.  Discussed use of built up foam on utensils to increase success with self-feeding.  Completed bathing and dressing at sit> stand level in room shower with pt requiring min assist for shower transfer and then supervision during bathing and dressing.  Educated on donning Rt shoe with added AFO with pt able to return demonstrate.  Reiterated to pt and her son the importance of hands on assist with all transfers and mobility and supervision with self-care tasks, both report understanding.  2) Engaged in therapeutic activity with focus on [facility] activities and education to utilize vision to  compensate for decreased sensation. 9 hole peg test Lt: 29 seconds and Rt: 34 seconds with noted mild difficulty with picking up pegs on Rt due to decreased sensation.  Educated on adaptive techniques to increase success with opening pill bottles and dispensing pills.  Educated pt and pt's son on safety in kitchen secondary to decreased sensation in [place] and reiterated w/c level in kitchen to increase safety.  Pt completed 5x sit > stand requiring 16.44 seconds indicating high risk of falls.  Discussed follow up therapy and d/c recommendations.  Pt and pt's son report no further questions and ready for d/c.  Therapy Documentation Precautions:  Precautions Precautions: Fall Precaution Comments: bil knee OA Restrictions Weight Bearing Restrictions: No General:   Vital Signs: Therapy Vitals BP: (!) 142/70 mmHg Pain:  Pt with no c/o pain  See FIM for current functional status  Therapy/Group: Individual Therapy  [name] 12/03/2014, 12:25 PM

## 2014-12-03 NOTE — Progress Notes (Deleted)
Lipid Panel     Component Value Date/Time   CHOL 203* 11/12/2014 0655   TRIG 54 11/12/2014 0655   HDL 67 11/12/2014 0655   CHOLHDL 3.0 11/12/2014 0655   VLDL 11 11/12/2014 0655   LDLCALC 125* 11/12/2014 0655      Many patients benefit from treatment even if their cholesterol is at goal.  Goal: Total Cholesterol (CHOL) less than 160  Goal:  Triglycerides (TRIG) less than 150  Goal:  HDL greater than 40  Goal:  LDL (LDLCALC) less than 100   BLOOD PRESSURE American Stroke Association blood pressure target is less that 120/80 mm/Hg  Your discharge blood pressure is:  BP: (!) 150/52 mmHg  Monitor your blood pressure  Limit your salt and alcohol intake  Many individuals will require more than one medication for high blood pressure  DIABETES (A1c is a blood sugar average for last 3 months) Goal HGBA1c is under 7% (HBGA1c is blood sugar average for last 3 months)  Diabetes:     Lab Results  Component Value Date   HGBA1C 6.6* 11/12/2014     Your HGBA1c can be lowered with medications, healthy diet, and exercise.  Check your blood sugar as directed by your physician  Call your physician if you experience unexplained or low blood sugars.  PHYSICAL ACTIVITY/REHABILITATION Goal is 30 minutes at least 4 days per week  Activity: No restrictions. Therapies: See above Return to work: N/A  Activity decreases your risk of heart attack and stroke and makes your heart stronger.  It helps control your weight and blood pressure; helps you relax and can improve your mood.  Participate in a regular exercise program.  Talk with your doctor about the best form of exercise for you (dancing, walking, swimming, cycling).  DIET/WEIGHT Goal is to maintain a healthy weight  Your discharge diet is: Diet Heart thin liquids Your height is:  Height: 5\' 5"  (165.1 cm) Your current weight is: Weight: 92.761 kg (204 lb 8 oz) Your Body Mass Index (BMI) is:    Following the type of diet specifically  designed for you will help prevent another stroke.  Your goal weight is:  150 lbs  Your goal Body Mass Index (BMI) is 19-24.  Healthy food habits can help reduce 3 risk factors for stroke:  High cholesterol, hypertension, and excess weight.  RESOURCES Stroke/Support Group:  Call [phone]   STROKE EDUCATION PROVIDED/REVIEWED AND GIVEN TO PATIENT Stroke warning signs and symptoms How to activate emergency medical system (call 911). Medications prescribed at discharge. Need for follow-up after discharge. Personal risk factors for stroke. Pneumonia vaccine given:  Flu vaccine given:  My questions have been answered, the writing is legible, and I understand these instructions.  I will adhere to these goals & educational materials that have been provided to me after my discharge from the hospital.       My questions have been answered and I understand these instructions. I will adhere to these goals and the provided educational materials after my discharge from the hospital.  Patient/Caregiver Signature _______________________________ Date __________  Clinician Signature _______________________________________ Date __________  Please bring this form and your medication list with you to all your follow-up doctor's appointments.

## 2014-12-03 NOTE — Discharge Summary (Signed)
Physician Discharge Summary  Patient ID: Laverne Hursey MRN: [id number] DOB/AGE: 06-19-1934 81 y.o.  Admit date: 11/16/2014 Discharge date: 12/03/2014  Discharge Diagnoses:  Principal Problem:   ICH (intracerebral hemorrhage) Active Problems:   Essential hypertension   Right hemiparesis   Numbness of face   Wheezing without diagnosis of asthma   Discharged Condition: Stable  Significant Diagnostic Studies: Dg Chest 2 View  11/16/2014   CLINICAL DATA:  Wheezing today.  EXAM: CHEST  2 VIEW  COMPARISON:  None.  FINDINGS: Cardiac enlargement with mild increased pulmonary vascularity. Small bilateral pleural effusions. Atelectasis in the lung bases. No focal consolidation. No pneumothorax.  IMPRESSION: Cardiac enlargement with mild pulmonary vascular congestion. Small bilateral pleural effusions.   Electronically Signed   By: Lucienne Capers M.D.   On: 11/16/2014 21:31     Labs:  Basic Metabolic Panel:    Component Value Date/Time   NA 139 11/15/2014 0710   K 3.5 11/15/2014 0710   CL 112 11/15/2014 0710   CO2 21 11/15/2014 0710   GLUCOSE 103* 11/15/2014 0710   BUN 9 11/15/2014 0710   CREATININE 0.92 11/30/2014 0557   CALCIUM 8.1* 11/15/2014 0710   GFRNONAA 58* 11/30/2014 0557   GFRAA 67* 11/30/2014 0557     CBC:    Component Value Date/Time   WBC 8.0 11/22/2014 0944   RBC 4.29 11/22/2014 0944   HGB 11.0* 11/22/2014 0944   HCT 34.3* 11/22/2014 0944   PLT 281 11/22/2014 0944   MCV 80.0 11/22/2014 0944   MCH 25.6* 11/22/2014 0944   MCHC 32.1 11/22/2014 0944   RDW 14.7 11/22/2014 0944   LYMPHSABS 1.7 11/22/2014 0944   MONOABS 0.6 11/22/2014 0944   EOSABS 0.1 11/22/2014 0944   BASOSABS 0.0 11/22/2014 0944     CBG: No results for input(s): GLUCAP in the last 168 hours.  Brief HPI:   Brenda Blanchard is a 81 y.o. female with history of untreated HTN who was admitted on 11/11/14 with acute onset of right sided weakness, difficulty talking and right facial droop. SBP>200 at  admission and patient treated with IV labetalol. CT head done revealing 20 x 22 mm area of hyperdense hemorrhage in the superior left thalamus, tracking into the posterior left corona radiata with intraventricular extension likely due to hypertensive bleed. Neurology recommended BP management with cardene drip and follow up CCT with stable bleed and no significant change in vasogenic edema.  Patient with resultant right hemiparesis with mild ataxia, right inattention with visual deficits, transcortical aphasia as well as problems with urinary retention. [facility] Course: Chesni Merfeld was admitted to rehab 11/16/2014 for inpatient therapies to consist of PT, ST and OT at least three hours five days a week. Past admission physiatrist, therapy team and rehab RN have worked together to provide customized collaborative inpatient rehab. Blood pressures have been followed closely and medications were titrated for tighter control. She had bradycardia at higher dose of atenolol therefore this was decreased to 25 mg daily. She was started on toileting program due to problems voiding and bladder function has improved with increase in activity. Follow up CBC showed H/H to be improving with Hgb at 11.0.  Mood has been stable and she has made steady progress with improvement in RUE weakness as well as cognitive function. She currently requires min assist with ADLs, cognitive tasks as well as mobility. Family education was done with son who will provide necessary physical and cognitive assistance needed past discharge She will continue  to receive follow up [facility], [facility] and [facility] by [facility] past discharge.    Rehab course: During patient's stay in rehab weekly team conferences were held to monitor patient's progress, set goals and discuss barriers to discharge. She has had improvement in activity tolerance, balance, postural control, as well as ability to compensate for deficits.  She is has had improvement  in functional use RUE  and RLE as well as improved awareness.  She is able to complete self care tasks with supervision.  She requires supervision to min guard assist for transfers and is ambulating 160 feet with min assist.  She requires supervision for basic cognitive tasks, min assist for semi-complex cognitive tasks due to decreased self monitoring and correction of errors, poor recall of new information, as well as decreased anticipatory awareness of challenges/obstacles due to her current deficits. .    Disposition:  Home  Diet: Heart Healthy  Special Instructions: 1.  Needs 24 hours supervision.     Medication List    STOP taking these medications        aspirin 325 MG tablet      TAKE these medications        acetaminophen 325 MG tablet  Commonly known as:  TYLENOL  Take 1-2 tablets (325-650 mg total) by mouth every 4 (four) hours as needed for mild pain.     amLODipine 10 MG tablet  Commonly known as:  NORVASC  Take 1 tablet (10 mg total) by mouth daily.     atenolol 25 MG tablet  Commonly known as:  TENORMIN  Take 1 tablet (25 mg total) by mouth daily.     cloNIDine 0.3 MG tablet  Commonly known as:  CATAPRES  Take 1 tablet (0.3 mg total) by mouth 3 (three) times daily.     fluticasone 50 MCG/ACT nasal spray  Commonly known as:  FLONASE  Place 1 spray into both nostrils daily.     pantoprazole 40 MG tablet  Commonly known as:  PROTONIX  Take 1 tablet (40 mg total) by mouth daily as needed (for indigestion).           Follow-up Information    Follow up with Charlett Blake, MD On 12/27/2014.   Specialty:  Physical Medicine and Rehabilitation   Why:  Be there 11:00 at 11;30 am for  appointment    Contact information:   [place] [address] [phone]       Follow up with Antony Contras, MD. Call on 01/17/2015.   Specialties:  Neurology, Radiology   Why:  BE there at 10:45 am for stroke follow up.    Contact information:    [address] [place] [address] [phone]       Follow up with Marinda Elk, MD On 12/10/2014.   Specialty:  Physician Assistant   Why:  APPT @ 10;00 AM   Contact information:   [place] [address] [phone]       Signed: Bary Leriche 12/03/2014, 3:17 PM

## 2014-12-03 NOTE — Progress Notes (Signed)
Social Work Patient ID: Brenda Blanchard, female   DOB: 02/21/1934, 81 y.o.   MRN: [id number] Son is here to attend therapies with pt and it is going well.  Discussed pt's need for PCP for follow up and she has no preference, will se tup with one. Have ordered equipment and follow up arranged.  Son aware Mom will need 24 hr care at discharge.  Prepare for discharge tomorrow.

## 2014-12-03 NOTE — Progress Notes (Signed)
Social Work Discharge Note Discharge Note  The overall goal for the admission was met for:   Discharge location: [place] TO ASSIST HER  Length of Stay: Yes-18 DAYS  Discharge activity level: Yes-SUPERVISION/MIN LEVEL  Home/community participation: Yes  Services provided included: MD, RD, PT, OT, SLP, RN, CM, TR, Pharmacy and SW  Financial Services: Medicare and Private Insurance: [employer]  Follow-up services arranged: Home Health: [facility]-PT,OT,RN,SP, DME: [facility]-WHEELCHAIR, TUB BENCH, BEDSIDE COMMODE, ROLLING WALKER and Patient/Family has no preference for HH/DME agencies  Comments (or additional information):SON HERE TO [facility] PCP FOR PT [place].  MAY GO BACK TO [place] WITH SON WHEN MEDICALLY ABLE TO FLY  Patient/Family verbalized understanding of follow-up arrangements: Yes  Individual responsible for coordination of the follow-up plan: SELF & BILLY-SON  Confirmed correct DME delivered: Brenda Blanchard 12/03/2014    Brenda Blanchard

## 2014-12-04 NOTE — Progress Notes (Signed)
Patient discharged to home with family about 1000 am. Patient discharge instructions given yesterday via Reesa Chew PA. Patient and family denied any questions. Patient equipment in room. [facility] called to pick up old wheelchair in room that was sent yesterday and was too small per patient. Vitals stable.

## 2014-12-04 NOTE — Progress Notes (Signed)
    Subjective/Complaints: Feels well, no complaints Eager to go home  Objective: Vital Signs: Blood pressure 147/63, pulse 54, temperature 98.1 F (36.7 C), temperature source Oral, resp. rate 18, height 5\' 5"  (1.651 m), weight 204 lb 8 oz (92.761 kg), SpO2 100 %.  Elderly female, NAD Chest CTA CV REG RATE ABD- soft, NT LE- trace pretibial edema  Assessment/Plan: 1. 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  Stable for discharge Medical Problem List and Plan: 1. 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  2. DVT Prophylaxis/Anticoagulation: Pharmaceutical:D/C Lovenox,amb better       3. Pain Management: prob sensory dyseasthesias RLE 4. Mood: LCSW to follow for evaluation and support.  5. Neuropsych: This patient is capable of making decisions on her own behalf. 6. Skin/Wound Care: Routine pressure relief measures. Maintain adequate intake.  7. Fluids/Electrolytes/Nutrition: Document I/O. Push po fluids as poor output reported.  8. HTN: 142/70-151/51 9. Wheezing: resolved 10. Urinary retention: improved 11.  Mild peripheral edema mainly on hemiplegic side due to poor venous return, antiedema measures  LOS (Days) 18 A FACE TO FACE EVALUATION WAS PERFORMED  SWORDS,BRUCE HENRY 12/04/2014, 6:26 AM

## 2014-12-23 DIAGNOSIS — I69351 Hemiplegia and hemiparesis following cerebral infarction affecting right dominant side: Secondary | ICD-10-CM | POA: Diagnosis not present

## 2014-12-23 DIAGNOSIS — I10 Essential (primary) hypertension: Secondary | ICD-10-CM | POA: Diagnosis not present

## 2014-12-23 DIAGNOSIS — I69320 Aphasia following cerebral infarction: Secondary | ICD-10-CM | POA: Diagnosis not present

## 2014-12-27 ENCOUNTER — Encounter: Payer: Medicare Other | Attending: Physical Medicine & Rehabilitation

## 2014-12-27 ENCOUNTER — Encounter: Payer: Self-pay | Admitting: Physical Medicine & Rehabilitation

## 2014-12-27 ENCOUNTER — Ambulatory Visit (HOSPITAL_BASED_OUTPATIENT_CLINIC_OR_DEPARTMENT_OTHER): Payer: Medicare Other | Admitting: Physical Medicine & Rehabilitation

## 2014-12-27 VITALS — BP 153/70 | HR 60

## 2014-12-27 DIAGNOSIS — G8190 Hemiplegia, unspecified affecting unspecified side: Secondary | ICD-10-CM | POA: Insufficient documentation

## 2014-12-27 DIAGNOSIS — G8191 Hemiplegia, unspecified affecting right dominant side: Secondary | ICD-10-CM

## 2014-12-27 DIAGNOSIS — I619 Nontraumatic intracerebral hemorrhage, unspecified: Secondary | ICD-10-CM

## 2014-12-27 NOTE — Patient Instructions (Signed)
Once you see Dr. Leonie Man, you can ask him for the clearance to fly back to [place]. Once you get [place] please get in with a primary care physician and they can make a referral to outpatient PT

## 2014-12-27 NOTE — Progress Notes (Signed)
Subjective:    Patient ID: Brenda Blanchard, female    DOB: 1934/05/27, 81 y.o.   MRN: [id number] 81 y.o. female with history of untreated HTN who was admitted on 11/11/14 with acute onset of right sided weakness, difficulty talking and right facial droop. SBP>200 at admission and patient treated with IV labetalol. CT head done revealing 20 x 22 mm area of hyperdense hemorrhage in the superior left thalamus, tracking into the posterior left corona radiata with intraventricular extension likely due to hypertensive bleed. Neurology recommended BP management with cardene drip and follow up CCT with stable bleed and no significant change in vasogenic edema.  Patient with resultant right hemiparesis with mild ataxia, right inattention with visual deficits, transcortical aphasia as well as problems with urinary retention Admit date: 11/16/2014 Discharge date: 12/03/2014   HPI Receiving home health PT, speech, nursing. Independent with dressing and bathing Walking independently with a walker but also without a walker for short distances. Patient is not performing any cooking or household tasks. Pain Inventory Average Pain 0 Pain Right Now 0 My pain is no pain  In the last 24 hours, has pain interfered with the following? General activity 0 Relation with others 0 Enjoyment of life 0 What TIME of day is your pain at its worst? no pain Sleep (in general) Good  Pain is worse with: no pain Pain improves with: no pain Relief from Meds: no pain  Mobility walk with assistance use a walker ability to climb steps?  yes do you drive?  no use a wheelchair transfers alone  Function retired  Neuro/Psych trouble walking  Prior Studies hospital f/u  Physicians involved in your care hospital f/u   Family History  Problem Relation Age of Onset  . Heart failure Mother   . Heart failure Father    History   Social History  . Marital Status: Unknown    Spouse Name: N/A  . Number of Children:  N/A  . Years of Education: N/A   Social History Main Topics  . Smoking status: Never Smoker   . Smokeless tobacco: Not on file  . Alcohol Use: No  . Drug Use: Not on file  . Sexual Activity: No   Other Topics Concern  . None   Social History Narrative   Past Surgical History  Procedure Laterality Date  . Abdominal hysterectomy  2013  . Tonsillectomy and adenoidectomy     Past Medical History  Diagnosis Date  . Hypertension   . Endometrial cancer   . Seasonal allergies    BP 153/70 mmHg  Pulse 60  SpO2 98%  Opioid Risk Score:   Fall Risk Score:  `1  Depression screen PHQ 2/9  No flowsheet data found.   Review of Systems  Musculoskeletal: Positive for gait problem.  All other systems reviewed and are negative.      Objective:   Physical Exam  Constitutional: She is oriented to person, place, and time. She appears well-developed and well-nourished.  HENT:  Head: Normocephalic and atraumatic.  Eyes: Conjunctivae are normal. Pupils are equal, round, and reactive to light.  Neck: Normal range of motion.  Musculoskeletal:       Right knee: She exhibits no deformity. No tenderness found.  Right knee without evidence of effusion, Right knee has no pain with passive range of motion but has diminished active range of motion  Neurological: She is alert and oriented to person, place, and time.  Absent sensation to light touch in the right lower  extremity Diminished light touch in the right upper extremity No facial droop Upper extremity strength right 4/5 in the deltoid, biceps, triceps, grip 5/5 in the left deltoid, biceps, triceps, grip 2 minus in the right knee extensor 1/5 hip flexor, 0 at the ankle plantar flexor dorsiflexor  Right AFO  Psychiatric: She has a normal mood and affect.  Nursing note and vitals reviewed.         Assessment & Plan:  1. superior left thalamus Hematoma 11/12/2014-Primary deficit is right-sided sensation is severely affected.  She is getting some dysesthetic pain as well. Lower extremity deficits are greater than upper extremity. She continues to require AFO for ambulation due to severe weakness at foot and ankle muscles. Continue home health therapy. She will follow up with neurology April 11.They will look at need for anticoagulation and other secondary stroke prevention needs. Once cleared by then she'll go back to [place] to live with her son and will need outpatient therapy at that time. Discussed with patient and son agree with plan  2.  Right knee pain no prior history of surgery or injury to that side. Pain started after stroke, She may have some intra-articular derangement or osteoarthritis. She feels popping at times and this is accompanied by pain but this is also not consistent. She does not want to get started on any type of additional medications or even a diclofenac gel  Follow-up With me on a when necessary basis

## 2015-01-06 ENCOUNTER — Telehealth: Payer: Self-pay | Admitting: *Deleted

## 2015-01-06 NOTE — Telephone Encounter (Signed)
Indianna from [facility] called reporting a missed visit this week with patient, asking for a verbal order to extend one more week.....Marland Kitchenverbal order given per office protocol

## 2015-01-10 ENCOUNTER — Telehealth: Payer: Self-pay | Admitting: *Deleted

## 2015-01-10 NOTE — Telephone Encounter (Signed)
Brenda Blanchard PT [facility] called to get vo for 2 additional PT visits.  Approval given.

## 2015-01-17 ENCOUNTER — Encounter: Payer: Self-pay | Admitting: Neurology

## 2015-01-17 ENCOUNTER — Ambulatory Visit (INDEPENDENT_AMBULATORY_CARE_PROVIDER_SITE_OTHER): Payer: Medicare Other | Admitting: Neurology

## 2015-01-17 VITALS — BP 164/75 | HR 58 | Resp 20 | Ht 65.0 in | Wt 189.0 lb

## 2015-01-17 DIAGNOSIS — I610 Nontraumatic intracerebral hemorrhage in hemisphere, subcortical: Secondary | ICD-10-CM | POA: Diagnosis not present

## 2015-01-17 NOTE — Patient Instructions (Addendum)
I had a long d/w patient about her recent intracerebral hemorrhage risk for recurrent stroke/TIAs, personally independently reviewed imaging studies and stroke evaluation results and answered questions.Continue no antiplatelets  for secondary stroke prevention and maintain strict control of hypertension with blood pressure goal below 130/90, diabetes with hemoglobin A1c goal below 6.5% and lipids with LDL cholesterol goal below 100 mg/dL.  refer to outpatient physical and occupational therapy for gait and balance training. Patient plans to move to [place] I advised her to see a primary physician in that area and get referral to outpatient therapies and stroke neurologist. No followup in the future with me as she is moving out of the area. Stroke Prevention Some medical conditions and behaviors are associated with an increased chance of having a stroke. You may prevent a stroke by making healthy choices and managing medical conditions. HOW CAN I REDUCE MY RISK OF HAVING A STROKE?   Stay physically active. Get at least 30 minutes of activity on most or all days.  Do not smoke. It may also be helpful to avoid exposure to secondhand smoke.  Limit alcohol use. Moderate alcohol use is considered to be:  No more than 2 drinks per day for men.  No more than 1 drink per day for nonpregnant women.  Eat healthy foods. This involves:  Eating 5 or more servings of fruits and vegetables a day.  Making dietary changes that address high blood pressure (hypertension), high cholesterol, diabetes, or obesity.  Manage your cholesterol levels.  Making food choices that are high in fiber and low in saturated fat, trans fat, and cholesterol may control cholesterol levels.  Take any prescribed medicines to control cholesterol as directed by your health care provider.  Manage your diabetes.  Controlling your carbohydrate and sugar intake is recommended to manage diabetes.  Take any prescribed medicines to  control diabetes as directed by your health care provider.  Control your hypertension.  Making food choices that are low in salt (sodium), saturated fat, trans fat, and cholesterol is recommended to manage hypertension.  Take any prescribed medicines to control hypertension as directed by your health care provider.  Maintain a healthy weight.  Reducing calorie intake and making food choices that are low in sodium, saturated fat, trans fat, and cholesterol are recommended to manage weight.  Stop drug abuse.  Avoid taking birth control pills.  Talk to your health care provider about the risks of taking birth control pills if you are over 24 years old, smoke, get migraines, or have ever had a blood clot.  Get evaluated for sleep disorders (sleep apnea).  Talk to your health care provider about getting a sleep evaluation if you snore a lot or have excessive sleepiness.  Take medicines only as directed by your health care provider.  For some people, aspirin or blood thinners (anticoagulants) are helpful in reducing the risk of forming abnormal blood clots that can lead to stroke. If you have the irregular heart rhythm of atrial fibrillation, you should be on a blood thinner unless there is a good reason you cannot take them.  Understand all your medicine instructions.  Make sure that other conditions (such as anemia or atherosclerosis) are addressed. SEEK IMMEDIATE MEDICAL CARE IF:   You have sudden weakness or numbness of the face, arm, or leg, especially on one side of the body.  Your face or eyelid droops to one side.  You have sudden confusion.  You have trouble speaking (aphasia) or understanding.  You have  sudden trouble seeing in one or both eyes.  You have sudden trouble walking.  You have dizziness.  You have a loss of balance or coordination.  You have a sudden, severe headache with no known cause.  You have new chest pain or an irregular heartbeat. Any of these  symptoms may represent a serious problem that is an emergency. Do not wait to see if the symptoms will go away. Get medical help at once. Call your local emergency services (911 in [place] Do not drive yourself to the hospital. Document Released: 11/01/2004 Document Revised: 02/08/2014 Document Reviewed: 03/27/2013 [facility] Patient Information 2015 [place], [place]. This information is not intended to replace advice given to you by your health care provider. Make sure you discuss any questions you have with your health care provider.

## 2015-01-17 NOTE — Progress Notes (Signed)
[facility] [address] [place]. [place] [address] [phone]       OFFICE FOLLOW-UP NOTE  Ms. Brenda Blanchard Date of Birth:  May 21, 1934 Medical Record Number:  [id number]   HPI: [name] seen today for the first office follow-up visit following hospital admission for intracerebral hemorrhage on 11/11/14. She presented with sudden onset of right hemiparesis and right facial weakness while in the bathroom. Her blood pressure on admission was found to be greater than 751 systolic. CT scan of the head showed a 2 x 2.2 cm left superior thalamic intracerebral hemorrhage with tracking into the posterior left corona radiata and left lateral ventricle. The patient was admitted to the neuro intensive care unit and blood pressure was tightly controlled with close neurological monitoring. Follow-up exam and CT scan showed stable appearance of the hemorrhage. She was transitioned to oral medications and transferred to inpatient rehabilitation where she stayed for a month or so. She is currently at home and just finishing home therapy. She is able to walk with some assistance but she is scared to walk long distances. She has not yet been referred to outpatient therapy. She has a right foot drop but nevertheless an ankle brace which helps. She is also has weakness of the right grip and intrinsic hand muscles. She states her blood pressure is better controlled. She wants to move to [place] where she is from and wants to have ongoing medical care in that area. She is accompanied today by her son who lives in [place].  ROS:   14 system review of systems is positive for  leg swelling, hearing loss, feeling cold, joint pain and numbness and all other systems negative  PMH:  Past Medical History  Diagnosis Date  . Hypertension   . Endometrial cancer   . Seasonal allergies     Social History:  History   Social History  . Marital Status: Unknown    Spouse Name: N/A    . Number of Children: N/A  . Years of Education: N/A   Occupational History  . Not on file.   Social History Main Topics  . Smoking status: Never Smoker   . Smokeless tobacco: Not on file  . Alcohol Use: No  . Drug Use: No  . Sexual Activity: No   Other Topics Concern  . Not on file   Social History Narrative   Caffeine 2 cups daily, avg, soda every once and while.      Medications:   Current Outpatient Prescriptions on File Prior to Visit  Medication Sig Dispense Refill  . acetaminophen (TYLENOL) 325 MG tablet Take 1-2 tablets (325-650 mg total) by mouth every 4 (four) hours as needed for mild pain.    Marland Kitchen amLODipine (NORVASC) 10 MG tablet Take 1 tablet (10 mg total) by mouth daily. 30 tablet 2  . atenolol (TENORMIN) 25 MG tablet Take 1 tablet (25 mg total) by mouth daily. 30 tablet 2  . cloNIDine (CATAPRES) 0.3 MG tablet Take 1 tablet (0.3 mg total) by mouth 3 (three) times daily. 90 tablet 2  . fluticasone (FLONASE) 50 MCG/ACT nasal spray Place 1 spray into both nostrils daily. 909 g 2  . pantoprazole (PROTONIX) 40 MG tablet Take 1 tablet (40 mg total) by mouth daily as needed (for indigestion). 30 tablet 2   No current facility-administered medications on file prior to visit.    Allergies:   Allergies  Allergen Reactions  . Lisinopril     angioedema  Physical Exam General: well developed, well nourished, seated, in no evident distress Head: head normocephalic and atraumatic.  Neck: supple with no carotid or supraclavicular bruits Cardiovascular: regular rate and rhythm, no murmurs Musculoskeletal: no deformity Skin:  no rash/petichiae Vascular:  Normal pulses all extremities Filed Vitals:   01/17/15 1132  BP: 164/75  Pulse: 58  Resp: 20   Neurologic Exam Mental Status: Awake and fully alert. Oriented to place and time. Recent and remote memory intact. Attention span, concentration and fund of knowledge appropriate. Mood and affect appropriate.  Cranial  Nerves: Fundoscopic exam reveals sharp disc margins. Pupils equal, briskly reactive to light. Extraocular movements full without nystagmus. Visual fields full to confrontation. Hearing intact. Facial sensation intact. Face, tongue, palate moves normally and symmetrically.  Motor: Normal bulk and tone. Mild right hemiparesis 4/5 strength with weakness predominantly of the right grip, intrinsic hand muscles and ankle dorsiflexor with mild right foot drop. Increased tone on the right. Impaired coordination on the right.  Sensory.: intact to touch ,pinprick .position and vibratory sensation on the left but diminished on the right hemibody.  Coordination: Slightly impaired finger-to-nose and knee to coordination on the right and normal on the left  Gait and Station: Arises from chair with mild difficulty. Hemiplegic gait with right foot drop and circumduction and tracking of the right leg  Reflexes: 2+ and asymmetric and brisker on the right. Toes downgoing.   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    PLAN: I had a long d/w patient about her recent intracerebral hemorrhage risk for recurrent stroke/TIAs, personally independently reviewed imaging studies and stroke evaluation results and answered questions.Continue no antiplatelets  for secondary stroke prevention and maintain strict control of hypertension with blood pressure goal below 130/90, diabetes with hemoglobin A1c goal below 6.5% and lipids with LDL cholesterol goal below 100 mg/dL.  refer to outpatient physical and occupational therapy for gait and balance training. Patient plans to move to [place] I advised her to see a primary physician in that area and get referral to outpatient therapies and stroke neurologist. No followup in the future with me as she is moving out of the area.   Note: This  document was prepared with digital dictation and possible smart phrase technology. Any transcriptional errors that result from this process are unintentional

## 2015-02-26 ENCOUNTER — Other Ambulatory Visit: Payer: Self-pay | Admitting: Physical Medicine and Rehabilitation

## 2016-08-16 IMAGING — CT CT HEAD W/O CM
1 series · 15 of 29 positions shown, 19 images · non-contrast
Comparison: CT of the head November 11, 2014

CLINICAL DATA: RIGHT-sided weakness and RIGHT facial droop,
followup nonhemorrhagic stroke. History of hypertension.

EXAM:
CT HEAD WITHOUT CONTRAST
TECHNIQUE: Contiguous axial images were obtained from the base of the skull
through the vertex without intravenous contrast.

[Series 2: head 5.0 h30s · axial · 0.42mm/px · z∈[-177,-47]mm · 15 of 29 slices shown, 19 images]
[im 2/29  brain]
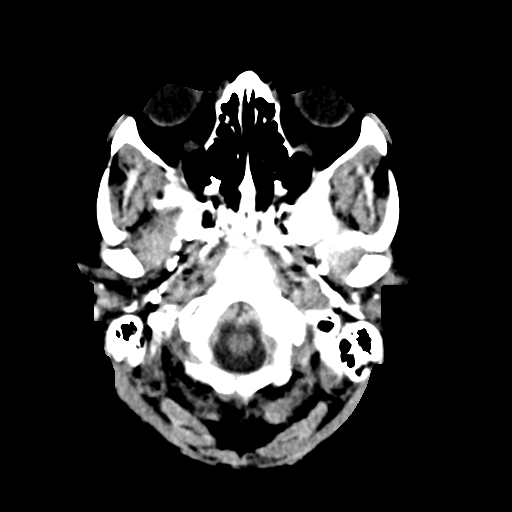
[im 2/29  bone]
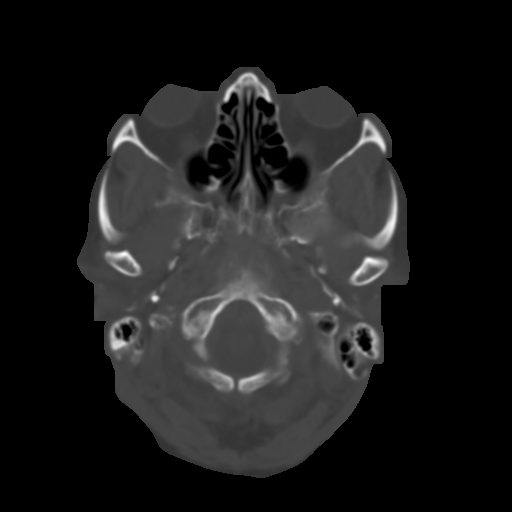
[im 4/29  brain]
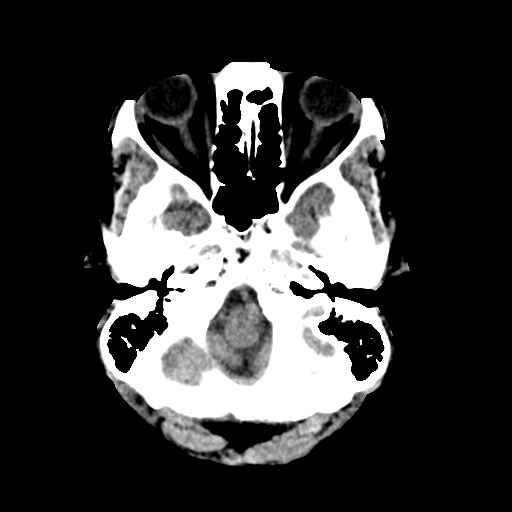
[im 6/29  brain]
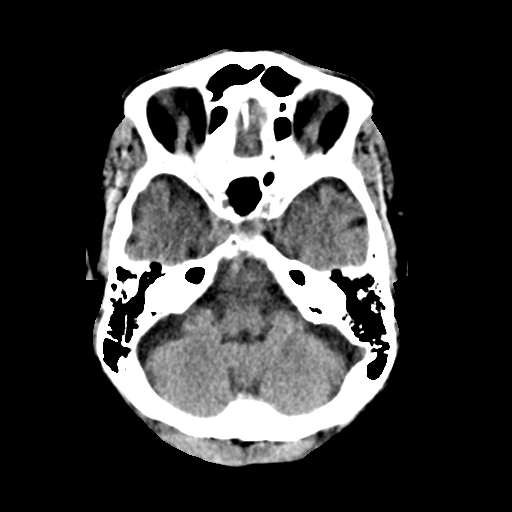
[im 8/29  brain]
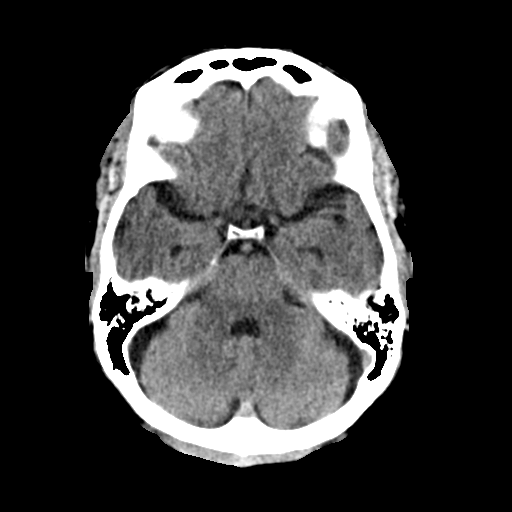
[im 10/29  brain]
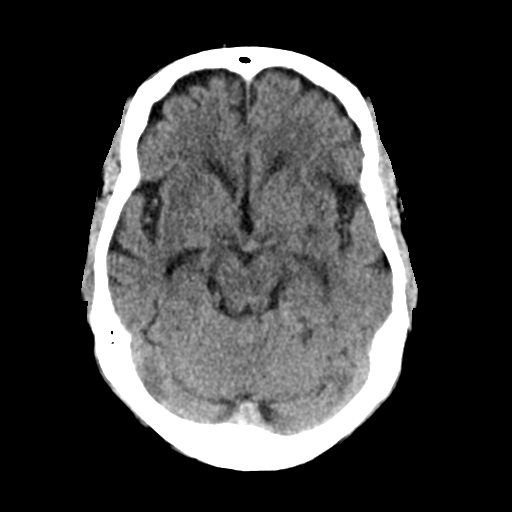
[im 10/29  bone]
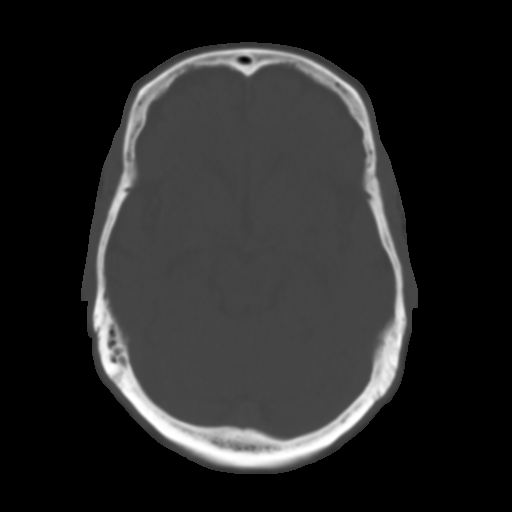
[im 11/29  brain]
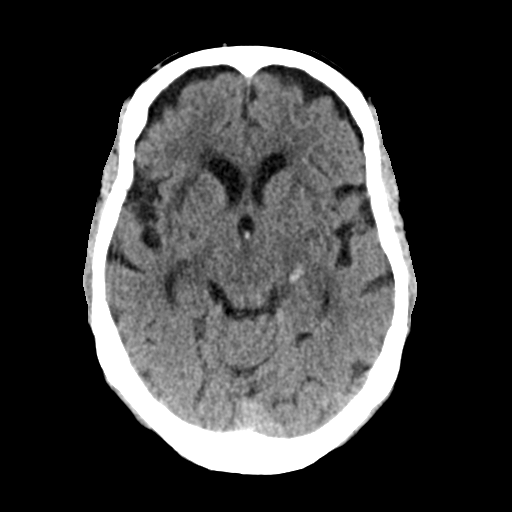
[im 13/29  brain]
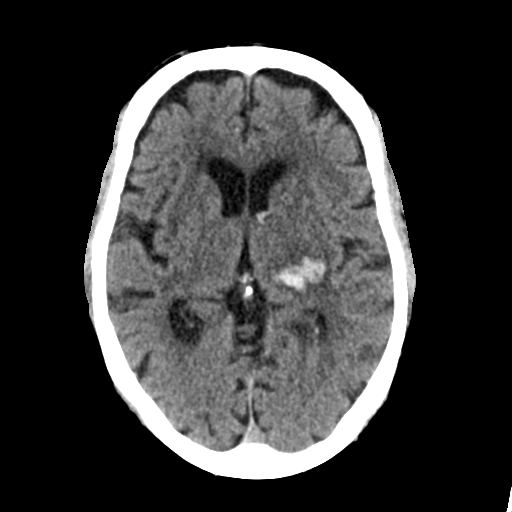
[im 15/29  brain]
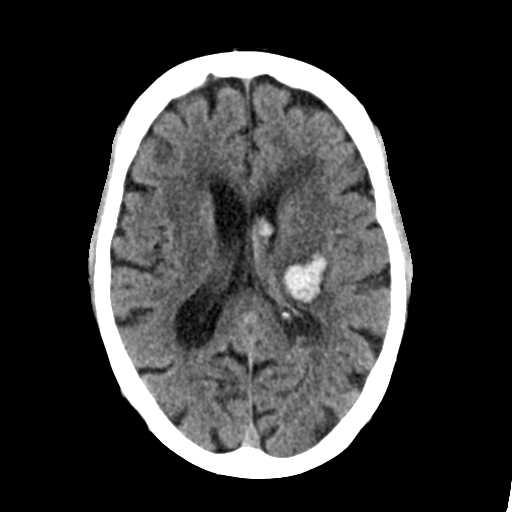
[im 17/29  brain]
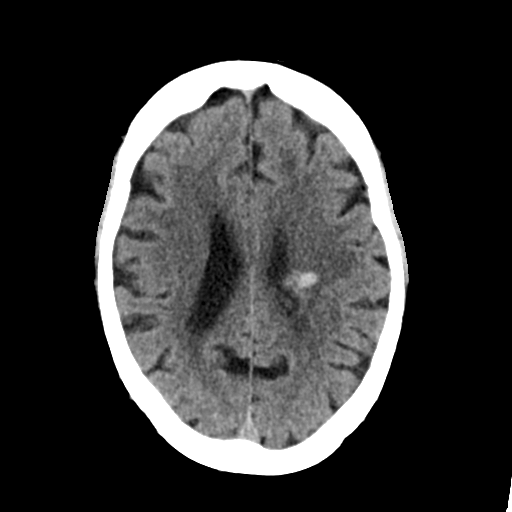
[im 17/29  bone]
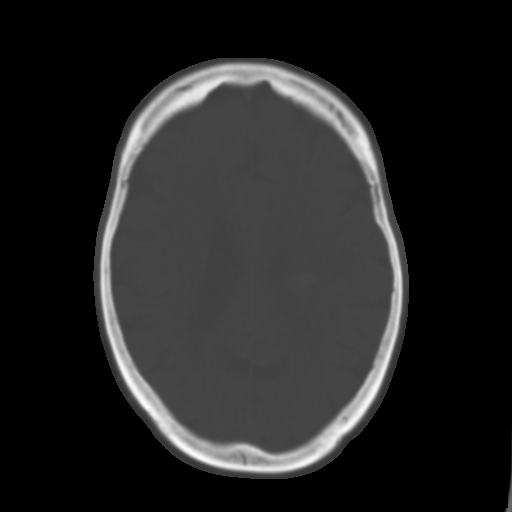
[im 19/29  brain]
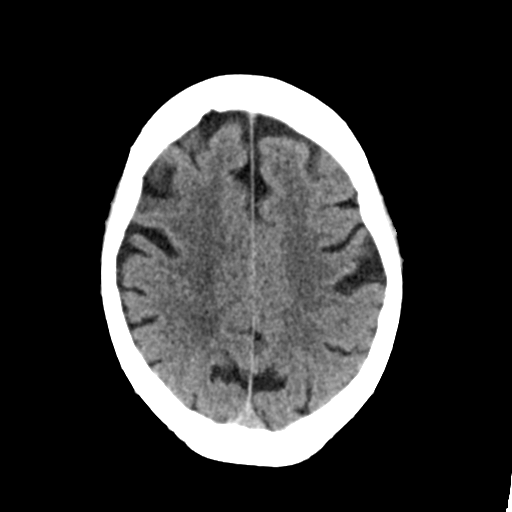
[im 20/29  brain]
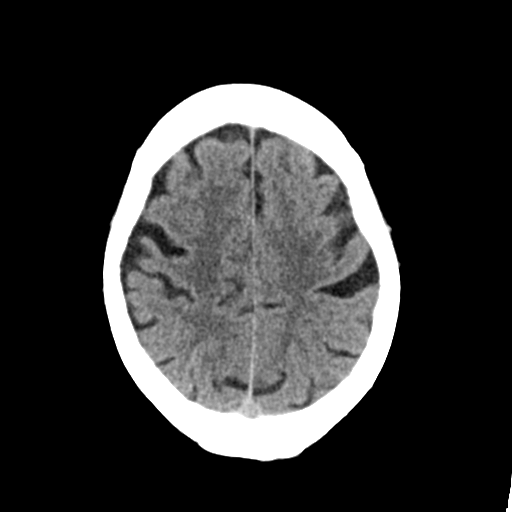
[im 22/29  brain]
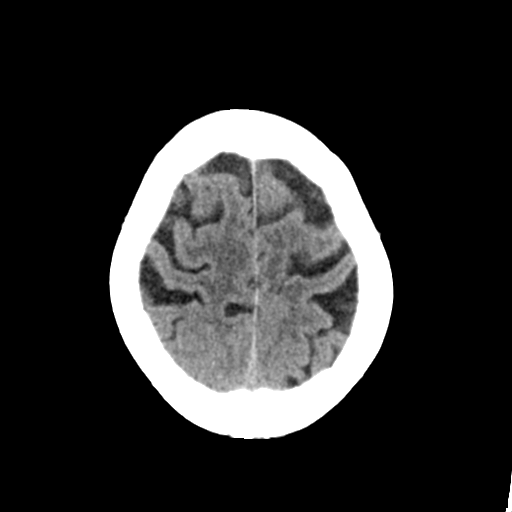
[im 24/29  brain]
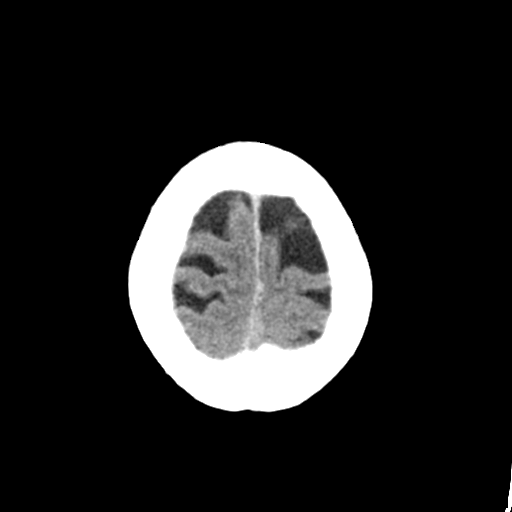
[im 24/29  bone]
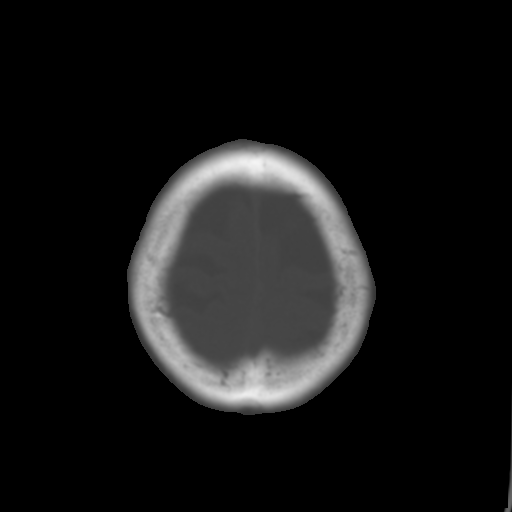
[im 26/29  brain]
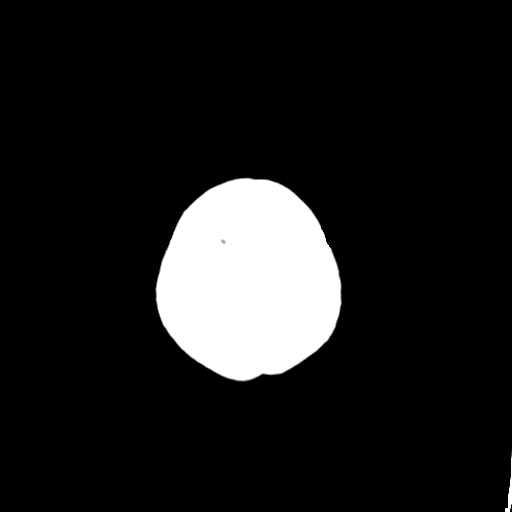
[im 28/29  brain]
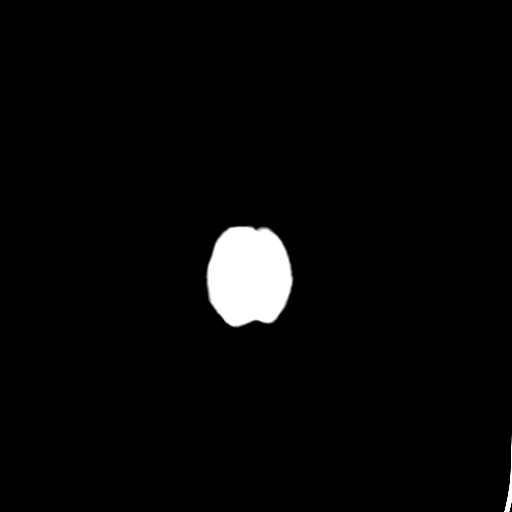

[15 of 29 positions shown; findings below may reference images not displayed]

FINDINGS: Evolving LEFT temporal 19 mm lobulated intraparenchymal hematoma
with surrounding low-density vasogenic edema. Intraventricular
extension again noted with a small amount of residual blood products
in LEFT lateral and third ventricle, no hydrocephalus. No Re plate.
No acute large vascular territory infarct. No significant mass
effect or midline shift. Mild white matter changes can be seen with
chronic small vessel ischemic disease.

No abnormal extra-axial fluid collections. Basal cisterns are
patent. Mild calcific atherosclerosis of the carotid siphons. Status
post bilateral ocular lens implants. Trace paranasal sinus mucosal
thickening without air-fluid levels. The mastoid air cells are
well-aerated. No skull fracture.
IMPRESSION: Evolving LEFT thalamus intraparenchymal 2 cm hematoma with
intraventricular extension. No hydrocephalus.

Mild white matter changes can be seen with chronic small vessel
ischemic disease. Involutional changes.

  By: Abdoul De Armas

## 2016-08-16 IMAGING — CT CT HEAD W/O CM
1 series · 15 of 30 positions shown, 19 images · non-contrast
Comparison: 11/12/2014 at 4851 hr

CLINICAL DATA: Worsening neurological exam. Obtunded. Left thalamic
hemorrhage.

EXAM:
CT HEAD WITHOUT CONTRAST
TECHNIQUE: Contiguous axial images were obtained from the base of the skull
through the vertex without intravenous contrast.

[Series 2: head 5.0 h30s · axial · 0.44mm/px · z∈[-92,+43]mm · 15 of 30 slices shown, 19 images]
[im 2/30  brain]
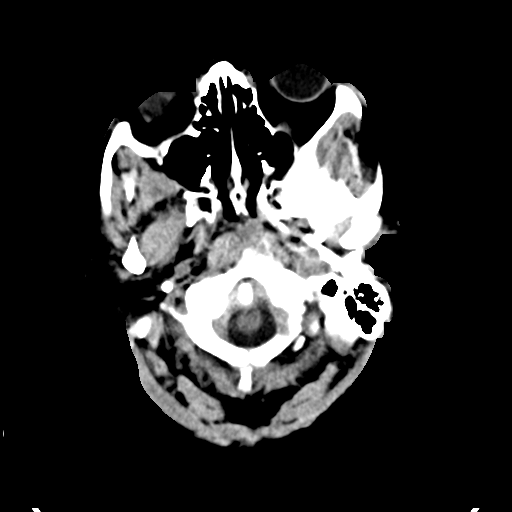
[im 2/30  bone]
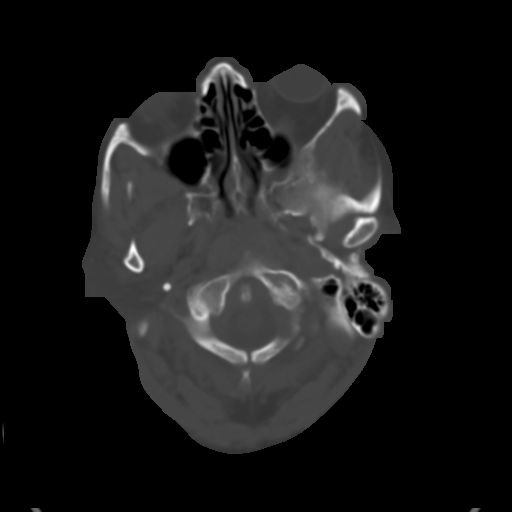
[im 4/30  brain]
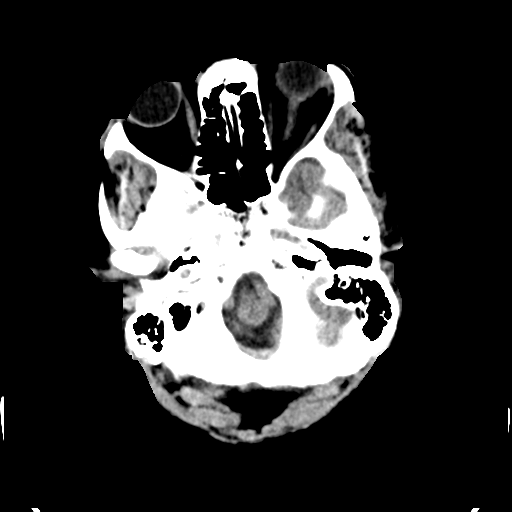
[im 6/30  brain]
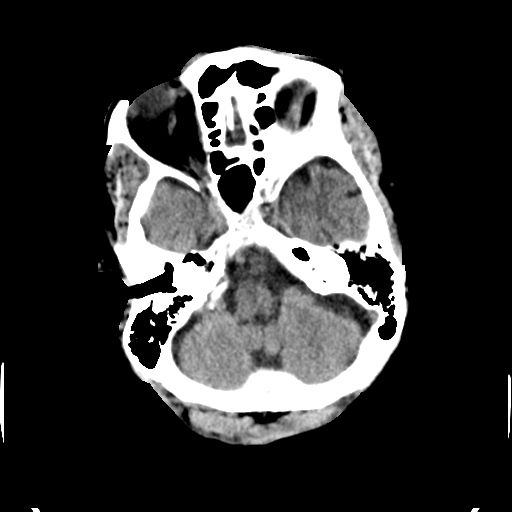
[im 8/30  brain]
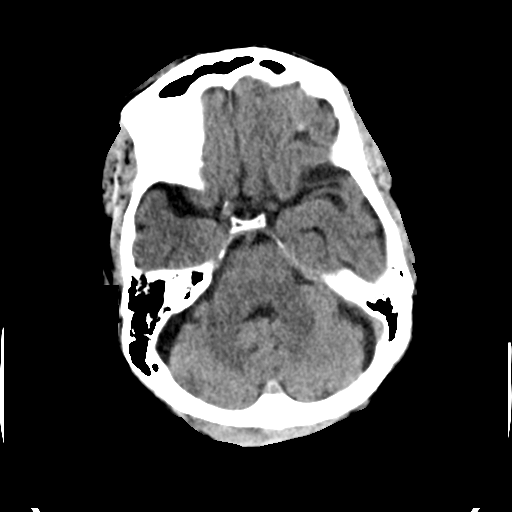
[im 10/30  brain]
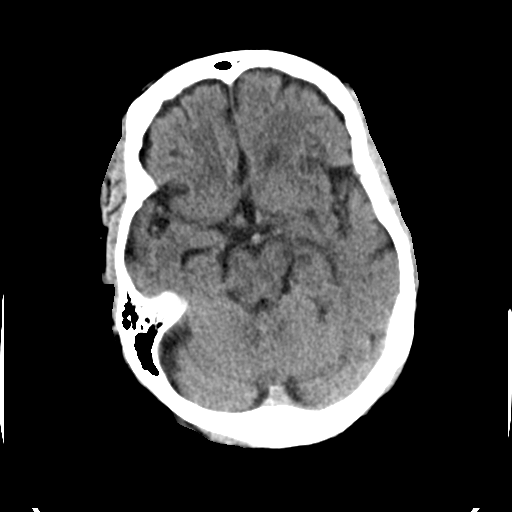
[im 10/30  bone]
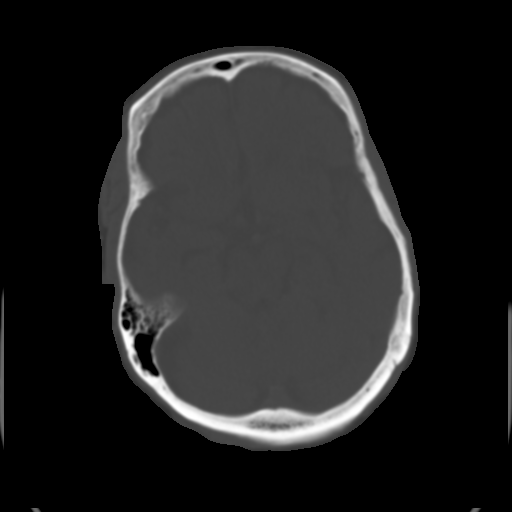
[im 12/30  brain]
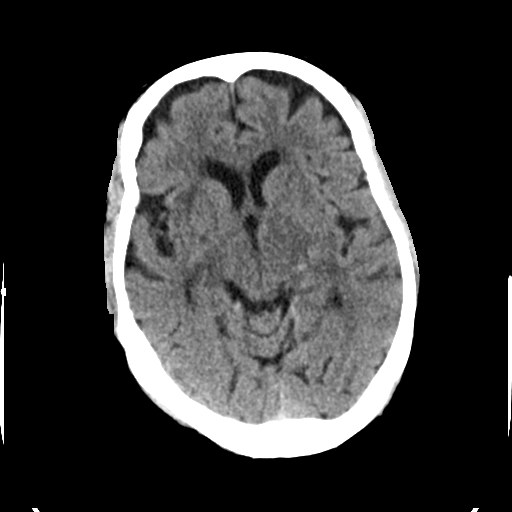
[im 14/30  brain]
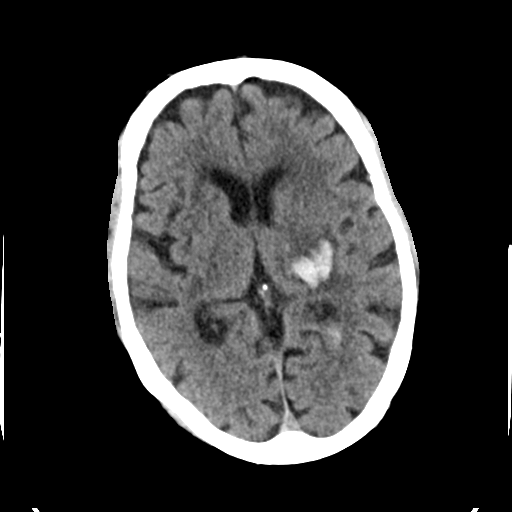
[im 16/30  brain]
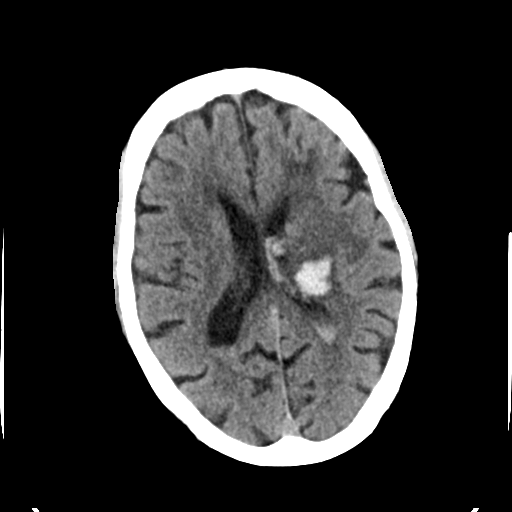
[im 17/30  brain]
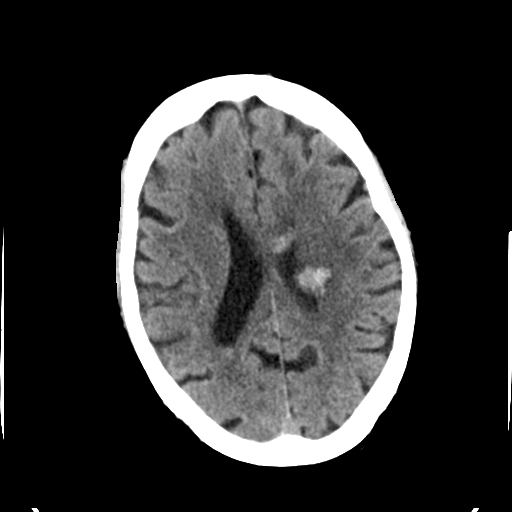
[im 17/30  bone]
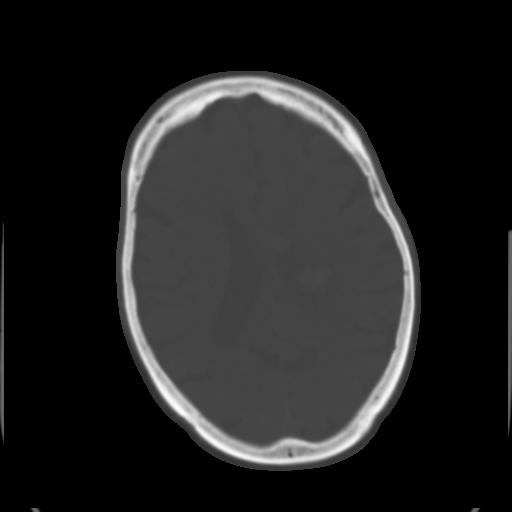
[im 19/30  brain]
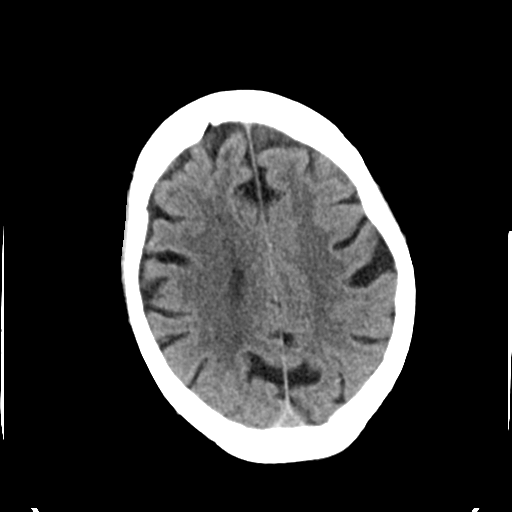
[im 21/30  brain]
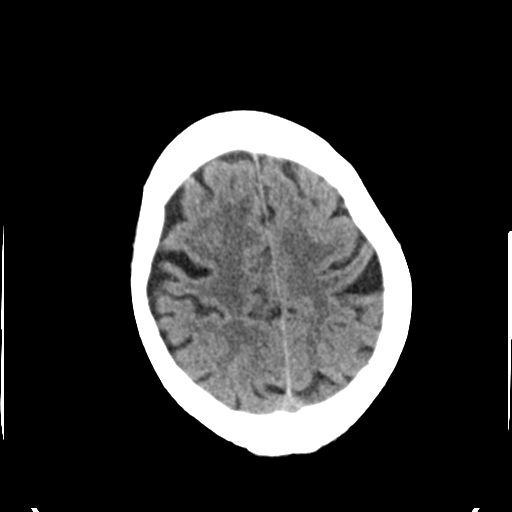
[im 23/30  brain]
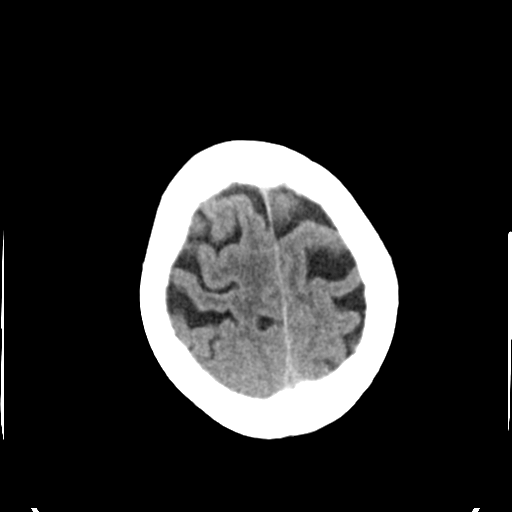
[im 25/30  brain]
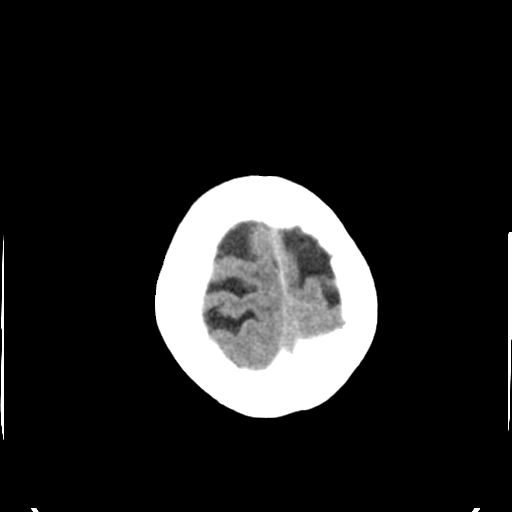
[im 25/30  bone]
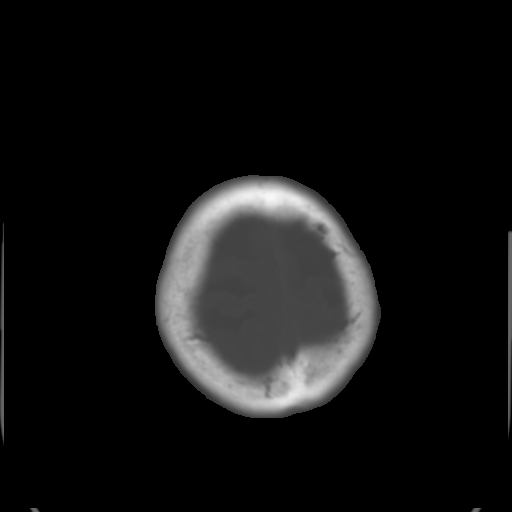
[im 27/30  brain]
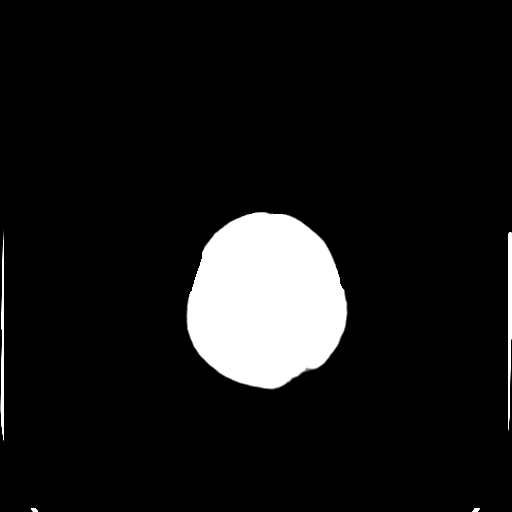
[im 29/30  brain]
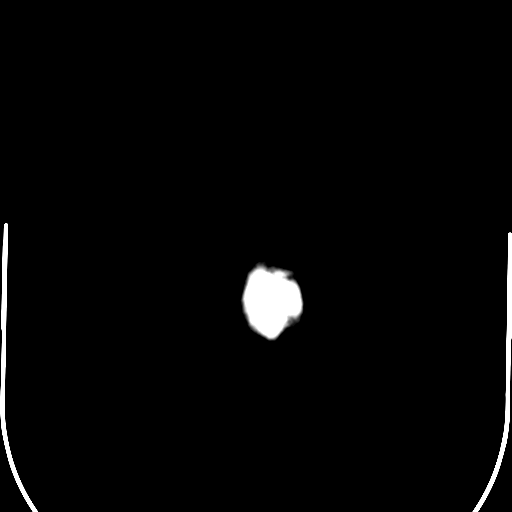

[15 of 30 positions shown; findings below may reference images not displayed]

FINDINGS: 1.9 cm acute hemorrhage in the left thalamus does not appear
significantly changed in size. Surrounding vasogenic edema also does
not appear significantly changed. Small amount of hemorrhage in the
left lateral ventricle is in a slightly different distribution than
on the prior studies and may have minimally increased in amount.
There is also likely a trace amount of hemorrhage in the right
lateral ventricle. Ventricles are unchanged in size without evidence
of hydrocephalus.

There is mild generalized cerebral atrophy. No acute large territory
cortical infarct, midline shift, or extra-axial fluid collection is
identified. Periventricular white matter hypodensities are similar
to the prior study and nonspecific but compatible with mild chronic
small vessel ischemic disease. A chronic lacunar infarct in the left
caudate is noted.

Prior bilateral cataract extraction is noted. Mastoid air cells are
clear. Mild osseous wall thickening in the right frontal sinus.
IMPRESSION: Unchanged size of acute left thalamic hemorrhage. Stable to
minimally increased intraventricular hemorrhage. No hydrocephalus.

## 2016-08-20 IMAGING — CR DG CHEST 2V
2 series · 2 of 2 positions shown · non-contrast
Comparison: None.

CLINICAL DATA: Wheezing today.

EXAM:
CHEST  2 VIEW

[chest lat]
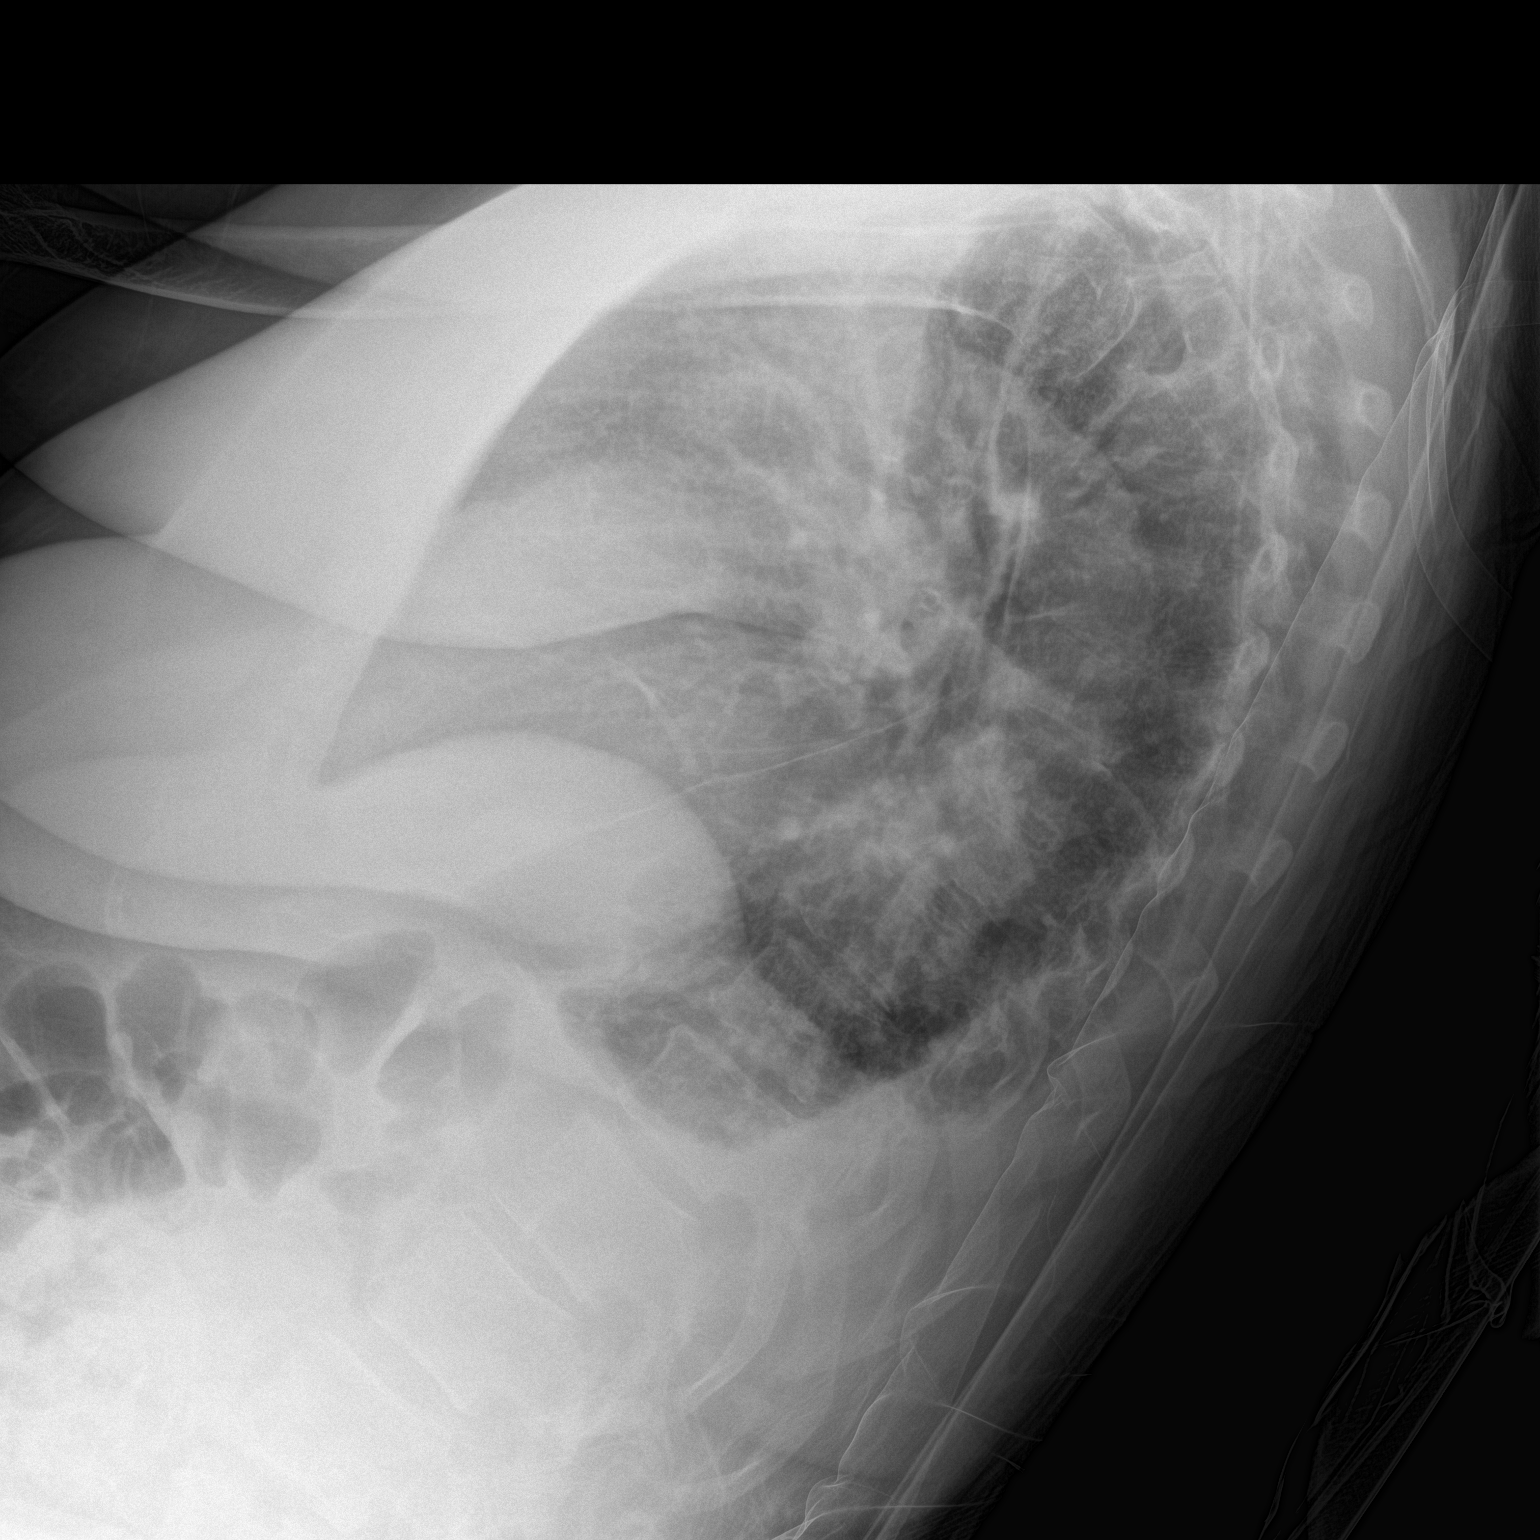

[chest ap]
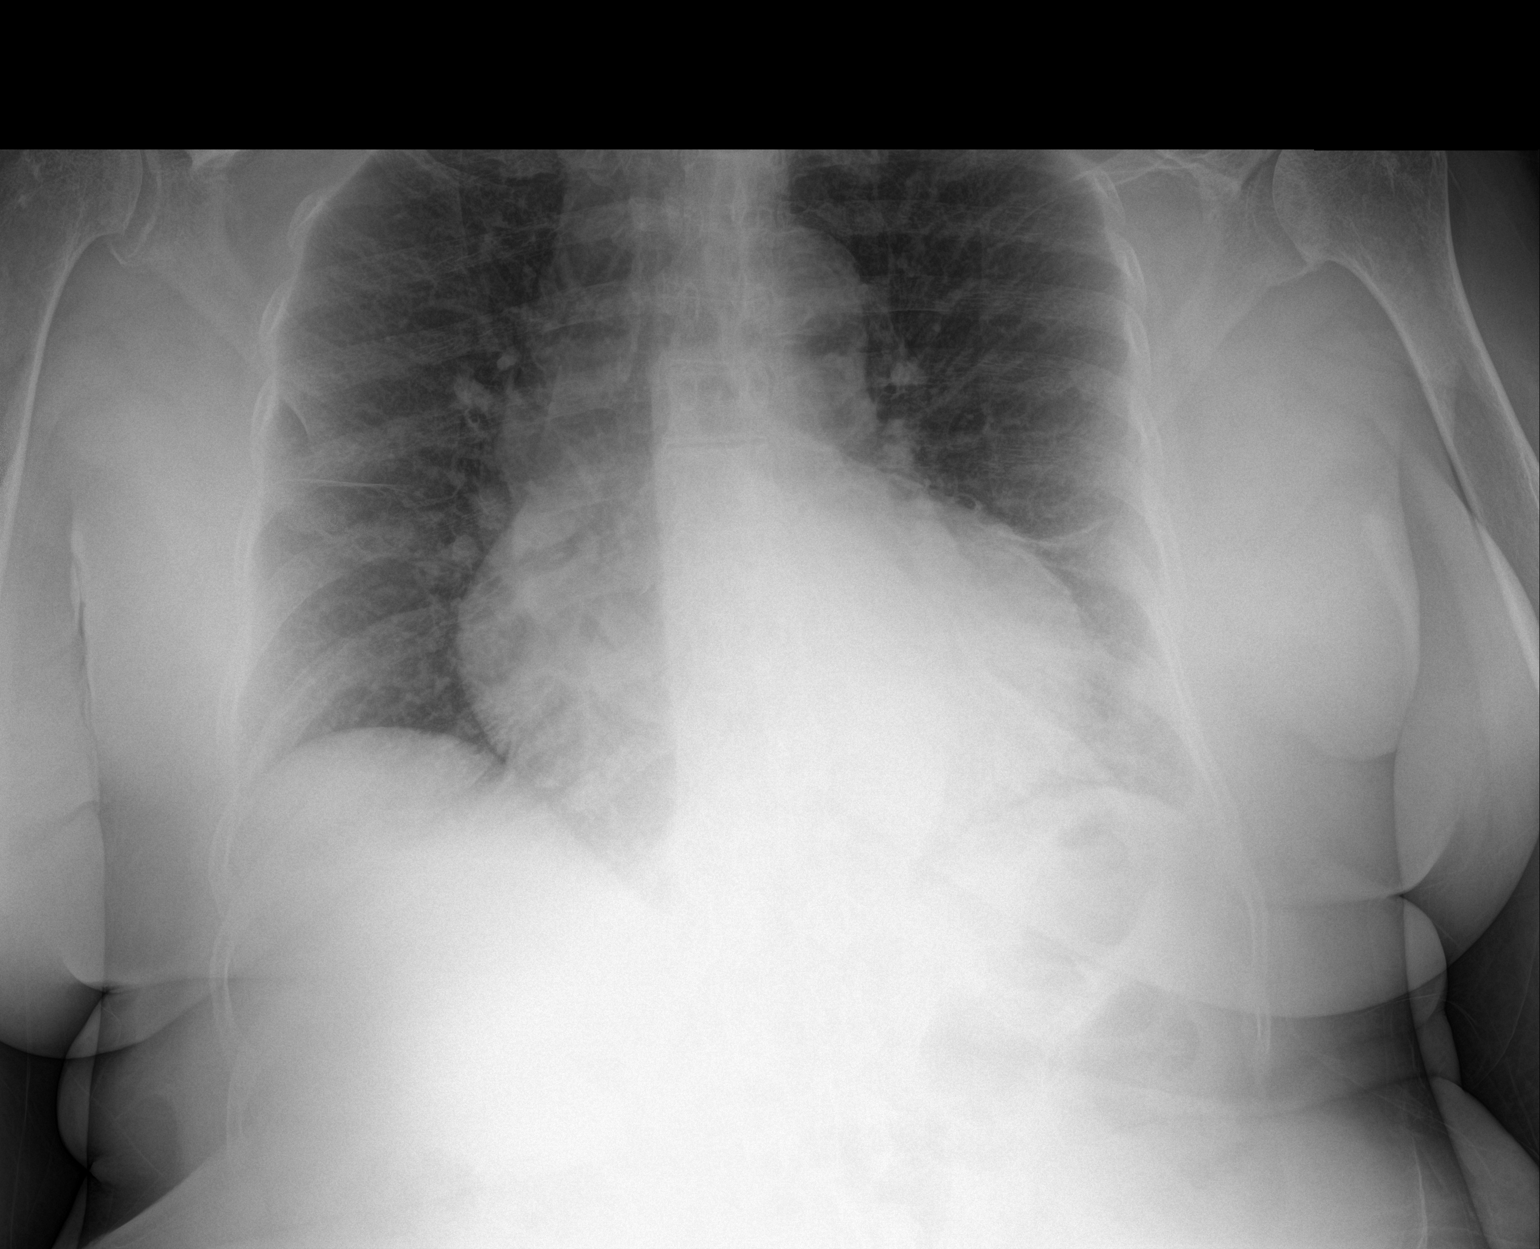

[2 of 2 positions shown; findings below may reference images not displayed]

FINDINGS: Cardiac enlargement with mild increased pulmonary vascularity. Small
bilateral pleural effusions. Atelectasis in the lung bases. No focal
consolidation. No pneumothorax.
IMPRESSION: Cardiac enlargement with mild pulmonary vascular congestion. Small
bilateral pleural effusions.
